# Patient Record
Sex: Female | Born: 1985 | Race: White | Hispanic: No | Marital: Married | Smoking: Never smoker
Health system: Southern US, Community
[De-identification: ages and names within clinical notes are randomized; demographics above are authoritative.]

## PROBLEM LIST (undated history)

## (undated) DIAGNOSIS — F419 Anxiety disorder, unspecified: Secondary | ICD-10-CM

## (undated) DIAGNOSIS — K219 Gastro-esophageal reflux disease without esophagitis: Secondary | ICD-10-CM

## (undated) HISTORY — PX: THERAPEUTIC ABORTION: SHX798

## (undated) HISTORY — DX: Gastro-esophageal reflux disease without esophagitis: K21.9

## (undated) HISTORY — DX: Anxiety disorder, unspecified: F41.9

## (undated) HISTORY — PX: BREAST SURGERY: SHX581

---

## 2010-06-02 ENCOUNTER — Encounter (INDEPENDENT_AMBULATORY_CARE_PROVIDER_SITE_OTHER): Payer: Self-pay | Admitting: *Deleted

## 2010-06-15 ENCOUNTER — Ambulatory Visit: Payer: Self-pay | Admitting: Gastroenterology

## 2010-06-15 ENCOUNTER — Encounter: Payer: Self-pay | Admitting: Gastroenterology

## 2010-06-15 LAB — CONVERTED CEMR LAB
ALT: 13 units/L (ref 0–35)
AST: 16 units/L (ref 0–37)
Albumin: 3.8 g/dL (ref 3.5–5.2)
Alkaline Phosphatase: 33 units/L — ABNORMAL LOW (ref 39–117)
BUN: 17 mg/dL (ref 6–23)
Basophils Absolute: 0 10*3/uL (ref 0.0–0.1)
Basophils Relative: 0.4 % (ref 0.0–3.0)
Bilirubin, Direct: 0.1 mg/dL (ref 0.0–0.3)
CO2: 28 meq/L (ref 19–32)
Calcium: 9.1 mg/dL (ref 8.4–10.5)
Chloride: 105 meq/L (ref 96–112)
Creatinine, Ser: 0.9 mg/dL (ref 0.4–1.2)
Eosinophils Absolute: 0.2 10*3/uL (ref 0.0–0.7)
Eosinophils Relative: 3.1 % (ref 0.0–5.0)
Ferritin: 32.8 ng/mL (ref 10.0–291.0)
Folate: 7.9 ng/mL
GFR calc non Af Amer: 78.18 mL/min (ref >60.00)
Glucose, Bld: 86 mg/dL (ref 70–99)
HCT: 36.5 % (ref 36.0–46.0)
Hemoglobin: 12.6 g/dL (ref 12.0–15.0)
IgA: 260 mg/dL (ref 68–378)
Iron: 129 ug/dL (ref 42–145)
Lymphocytes Relative: 33.7 % (ref 12.0–46.0)
Lymphs Abs: 2 10*3/uL (ref 0.7–4.0)
MCHC: 34.7 g/dL (ref 30.0–36.0)
MCV: 86.7 fL (ref 78.0–100.0)
Monocytes Absolute: 0.3 10*3/uL (ref 0.1–1.0)
Monocytes Relative: 5.1 % (ref 3.0–12.0)
Neutro Abs: 3.5 10*3/uL (ref 1.4–7.7)
Neutrophils Relative %: 57.7 % (ref 43.0–77.0)
Platelets: 171 10*3/uL (ref 150.0–400.0)
Potassium: 4.5 meq/L (ref 3.5–5.1)
RBC: 4.21 M/uL (ref 3.87–5.11)
RDW: 12.6 % (ref 11.5–14.6)
Saturation Ratios: 28.2 % (ref 20.0–50.0)
Sodium: 140 meq/L (ref 135–145)
TSH: 1.3 microintl units/mL (ref 0.35–5.50)
Total Bilirubin: 0.6 mg/dL (ref 0.3–1.2)
Total Protein: 6.8 g/dL (ref 6.0–8.3)
Transferrin: 327 mg/dL (ref 212.0–360.0)
Vitamin B-12: 406 pg/mL (ref 211–911)
WBC: 6 10*3/uL (ref 4.5–10.5)

## 2010-06-16 ENCOUNTER — Encounter: Payer: Self-pay | Admitting: Gastroenterology

## 2010-06-16 LAB — CONVERTED CEMR LAB: Tissue Transglutaminase Ab, IgA: 4.5 units (ref <20)

## 2010-06-18 ENCOUNTER — Ambulatory Visit (HOSPITAL_COMMUNITY)
Admission: RE | Admit: 2010-06-18 | Discharge: 2010-06-18 | Payer: Self-pay | Source: Home / Self Care | Attending: Gastroenterology | Admitting: Gastroenterology

## 2010-06-24 ENCOUNTER — Telehealth: Payer: Self-pay | Admitting: Gastroenterology

## 2010-07-14 ENCOUNTER — Encounter: Payer: Self-pay | Admitting: Gastroenterology

## 2010-07-14 ENCOUNTER — Ambulatory Visit
Admission: RE | Admit: 2010-07-14 | Discharge: 2010-07-14 | Payer: Self-pay | Source: Home / Self Care | Attending: Gastroenterology | Admitting: Gastroenterology

## 2010-07-14 ENCOUNTER — Other Ambulatory Visit: Payer: Self-pay | Admitting: Gastroenterology

## 2010-07-15 LAB — HELICOBACTER PYLORI SCREEN-BIOPSY: UREASE: NEGATIVE

## 2010-07-20 ENCOUNTER — Encounter: Payer: Self-pay | Admitting: Gastroenterology

## 2010-07-21 ENCOUNTER — Telehealth: Payer: Self-pay | Admitting: Gastroenterology

## 2010-07-22 ENCOUNTER — Telehealth: Payer: Self-pay | Admitting: Gastroenterology

## 2010-08-02 ENCOUNTER — Telehealth: Payer: Self-pay | Admitting: Gastroenterology

## 2010-08-02 DIAGNOSIS — K449 Diaphragmatic hernia without obstruction or gangrene: Secondary | ICD-10-CM | POA: Insufficient documentation

## 2010-08-03 NOTE — Letter (Signed)
Summary: New Patient letter  [facility]  [address] [place], [place] [address]   Phone: [phone]  Fax: [phone]       06/02/2010 MRN: [id number]  Cheryl Christensen [address] [place], [place]  [address]  Dear Ms. [facility],  Welcome to the Gastroenterology Division at [facility].    You are scheduled to see Dr.  Christella Hartigan on 07-16-10 at 9:00a.m. on the 3rd floor at [facility] [address]  We ask that you try to arrive at our office 15 minutes prior to your appointment time to allow for check-in.  We would like you to complete the enclosed self-administered evaluation form prior to your visit and bring it with you on the day of your appointment.  We will review it with you.  Also, please bring a complete list of all your medications or, if you prefer, bring the medication bottles and we will list them.  Please bring your insurance card so that we may make a copy of it.  If your insurance requires a referral to see a specialist, please bring your referral form from your primary care physician.  Co-payments are due at the time of your visit and may be paid by cash, check or credit card.     Your office visit will consist of a consult with your physician (includes a physical exam), any laboratory testing he/she may order, scheduling of any necessary diagnostic testing (e.g. x-ray, ultrasound, CT-scan), and scheduling of a procedure (e.g. Endoscopy, Colonoscopy) if required.  Please allow enough time on your schedule to allow for any/all of these possibilities.    If you cannot keep your appointment, please call [phone] to cancel or reschedule prior to your appointment date.  This allows [place] the opportunity to schedule an appointment for another patient in need of care.  If you do not cancel or reschedule by 5 p.m. the business day prior to your appointment date, you will be charged a $50.00 late cancellation/no-show fee.    Thank you for choosing  [facility] for your medical needs.  We appreciate the opportunity to care for you.  Please visit [place] at our website  to learn more about our practice.                     Sincerely,                                                             The Gastroenterology Division

## 2010-08-05 NOTE — Procedures (Addendum)
Summary: Upper Endoscopy  Patient: Cheryl Christensen Note: All result statuses are Final unless otherwise noted.  Tests: (1) Upper Endoscopy (EGD)   EGD Upper Endoscopy       DONE     [facility]     [address]     [place], [place]  [address]           ENDOSCOPY PROCEDURE REPORT           PATIENT:  [name], [name]  MR#:  [id number]     BIRTHDATE:  01-29-1986, 24 yrs. old  GENDER:  female           ENDOSCOPIST:  Vania Rea. Jarold Motto, MD, [facility]     Referred by:           PROCEDURE DATE:  07/14/2010     PROCEDURE:  EGD with biopsy, [address]     ASA CLASS:  Class I     INDICATIONS:  chest pain, dysphagia NORMAL UGI SERIES-BARIUM     SWALLOW.           MEDICATIONS:   Fentanyl 100 mcg IV, Versed 10 mg IV     TOPICAL ANESTHETIC:  Exactacain Spray           DESCRIPTION OF PROCEDURE:   After the risks benefits and     alternatives of the procedure were thoroughly explained, informed     consent was obtained.  The [facility] [id number] [id number] endoscope was     introduced through the mouth and advanced to the second portion of     the duodenum, without limitations.  The instrument was slowly     withdrawn as the mucosa was fully examined.     <<PROCEDUREIMAGES>>           ULTRASONIC FINDINGS:   A hiatal hernia was found. -3 CM HH NOTED.     Normal duodenal folds were noted. SI BX. DONE.  The stomach was     entered and closely examined. The antrum, angularis, and lesser     curvature were well visualized, including a retroflexed view of     the cardia and fundus. The stomach wall was normally distensable.     The scope passed easily through the pylorus into the duodenum. CLO     BX. DONE.  Esophagitis was found. HEALING EROSIONS.ESOPHAGEAL     BIOPSIES DONE.    Retroflexed views revealed a hiatal hernia.     The scope was then withdrawn from the patient and the procedure     completed.           COMPLICATIONS:  None           ENDOSCOPIC IMPRESSION:     1) Normal duodenal folds     2)  Normal stomach     3) Hiatal hernia     4) Esophagitis     5) A hiatal hernia     1.GERD     2.R/O EOSINOPHILIC ESOPHAGITIS     3.R/O CELIAC DISEASE     RECOMMENDATIONS:     1) Await biopsy results           2.RESUME ACIPHEX 20MG /QAM           3.SEE ME 3-4 WEEKS           REPEAT EXAM:  No           ______________________________     Vania Rea. Jarold Motto, MD, [facility]  CC:           n.     eSIGNED:   Vania Rea. Kenna Kirn at 07/14/2010 04:51 PM           Aram Candela, [id number]  Note: An exclamation mark (!) indicates a result that was not dispersed into the flowsheet. Document Creation Date: 07/14/2010 4:51 PM _______________________________________________________________________  (1) Order result status: Final Collection or observation date-time: 07/14/2010 16:42 Requested date-time:  Receipt date-time:  Reported date-time:  Referring Physician:   Ordering Physician: Sheryn Bison [phone]) Specimen Source:  Source: Launa Grill Order Number: [id number] Lab site:   Appended Document: Upper Endoscopy    Clinical Lists Changes  Problems: Changed problem from GERD (ICD-530.81) to HIATAL HERNIA WITH REFLUX (ICD-553.3)

## 2010-08-05 NOTE — Progress Notes (Signed)
Summary: Abd Pain  Phone Note Call from Patient Call back at Home Phone [phone]   Call For: Dr Jarold Motto Reason for Call: Talk to Nurse Summary of Call: Is still having abd pain. Initial call taken by: Leanor Kail [facility],  July 22, 2010 2:25 PM  Follow-up for Phone Call        Patient called back today to report she starting hurting last night like the original pain. She stated before, the pain stopped after a BM, but now it doesn't and her stools are now watery and she's gagging- dry heaves. Again, I offered to order the Abdominal U/S and Librax. Patient chose to order the Librax- she will call back if she worsens. Follow-up by: Graciella Freer RN,  July 22, 2010 2:49 PM    New/Updated Medications: LIBRAX 2.5-5 MG  CAPS (CLIDINIUM-CHLORDIAZEPOXIDE) 1 by mouth three times a day as needed for abdominal pain Prescriptions: LIBRAX 2.5-5 MG  CAPS (CLIDINIUM-CHLORDIAZEPOXIDE) 1 by mouth three times a day as needed for abdominal pain  #100 x 0   Entered by:   Graciella Freer RN   Authorized by:   Mardella Layman MD [facility]   Signed by:   Graciella Freer RN on 07/22/2010   Method used:   Electronically to        [facility]. [phone]* (retail)       [phone]       [place], [place]  [address]       Ph: [phone]       Fax: [phone]   RxID:   [id number]

## 2010-08-05 NOTE — Assessment & Plan Note (Signed)
Summary: acid reflux--ch.   History of Present Illness Visit Type: new patient  Primary GI MD: Sheryn Bison MD FACP FAGA Primary Provider: na Requesting Provider: na Chief Complaint: Discomfort in chest, fullness in chest after meals, and acid reflux  History of Present Illness:   [name] self referred for dysphagia for solids and liquids with postprandial fullness in her chest and minimal typical reflux symptoms. She does have belching and burping and uses p.r.n. and antacids. She has not had previous x-rays or endoscopic exams. Her swallowing problems have been present since childhood but worse over the last several months. She has not been to a physician in several years but denies any other medical problems or history of anemia. She is on daily birth control pills.   GI Review of Systems    Reports acid reflux and  belching.      Denies abdominal pain, bloating, chest pain, dysphagia with liquids, dysphagia with solids, heartburn, loss of appetite, nausea, vomiting, vomiting blood, weight loss, and  weight gain.        Denies anal fissure, black tarry stools, change in bowel habit, constipation, diarrhea, diverticulosis, fecal incontinence, heme positive stool, hemorrhoids, irritable bowel syndrome, jaundice, light color stool, liver problems, rectal bleeding, and  rectal pain.    Current Medications (verified): 1)  Loestrin 24 Fe 1-20 Mg-Mcg Tabs (Norethin Ace-Eth Estrad-Fe) .... As Directed  Allergies (verified): No Known Drug Allergies  Past History:  Past medical, surgical, family and social histories (including risk factors) reviewed for relevance to current acute and chronic problems.  Past Medical History: ANXIETY (ICD-300.00) BELCHING (ICD-787.3) GERD (ICD-530.81)  Past Surgical History: Unremarkable  Family History: Reviewed history and no changes required. 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  Alcohol Use - no Illicit Drug Use - no Smoking Status:  never Drug Use:  no  Review of Systems       The patient complains of anxiety-new.  The patient denies allergy/sinus, anemia, arthritis/joint pain, back pain, blood in urine, breast changes/lumps, change in vision, confusion, cough, coughing up blood, depression-new, fainting, fatigue, fever, headaches-new, hearing problems, heart murmur, heart rhythm changes, itching, menstrual pain, muscle pains/cramps, night sweats, nosebleeds, pregnancy symptoms, shortness of breath, skin rash, sleeping problems, sore throat, swelling of feet/legs, swollen lymph glands, thirst - excessive , urination - excessive , urination changes/pain, urine leakage, vision changes, and voice change.    Vital Signs:  Patient profile:   25 year old female Height:      66 inches Weight:      133 pounds BMI:     21.54 BSA:     1.68 Pulse rate:   88 / minute Pulse rhythm:   regular BP sitting:   124 / 72  (left arm) Cuff size:   regular  Vitals Entered By: Ok Anis CMA (June 15, 2010 2:24 PM)  Physical Exam  General:  Well developed, well nourished, no acute distress.healthy appearing.  healthy appearing.   Head:  Normocephalic and atraumatic. Eyes:  PERRLA, no icterus.exam deferred to patient's ophthalmologist.  exam deferred to patient's ophthalmologist.   Neck:  Supple; no masses or thyromegaly.Some small movable anterior cervical nodes bilaterally noted. Lungs:  Clear throughout to auscultation. Heart:  Regular rate and rhythm; no murmurs, rubs,  or bruits. Abdomen:  Soft, nontender and nondistended. No masses, hepatosplenomegaly or hernias noted. Normal bowel sounds. Msk:  Symmetrical with  no gross deformities. Normal posture. Extremities:  No clubbing, cyanosis, edema or deformities noted. Neurologic:  Alert and  oriented x4;  grossly normal neurologically. Skin:   Intact without significant lesions or rashes. Psych:  Alert and cooperative. Normal mood and affect.   Impression & Recommendations:  Problem # 1:  DYSPHAGIA UNSPECIFIED (ICD-787.20) Assessment Unchanged Her Symptoms suggest a possible underlying esophageal motility disorder--rule out achalasia. I have scheduled her for barium swallow and perhaps endoscopy. Screening labs have been ordered also.Empiric treatment with AcipHex 20 mg a day pending further workup and evaluation. She does have a sister who suffers from severe idiopathic iron deficiency. TLB-CBC Platelet - w/Differential (85025-CBCD) TLB-BMP (Basic Metabolic Panel-BMET) (80048-METABOL) TLB-Hepatic/Liver Function Pnl (80076-HEPATIC) TLB-TSH (Thyroid Stimulating Hormone) (84443-TSH) TLB-B12, Serum-Total ONLY (62952-W41) TLB-Ferritin (82728-FER) TLB-Folic Acid (Folate) (82746-FOL) TLB-IBC Pnl (Iron/FE;Transferrin) (83550-IBC) TLB-IgA (Immunoglobulin A) (82784-IGA) T-Sprue Panel (Celiac Disease Aby Eval) (83516x3/[address]) Barium Swallow with Tablet (BS w/tab)  Problem # 2:  GERD (ICD-530.81) Assessment: Unchanged Barium Swallow with office followup in 2 weeks' time as per above.  Patient Instructions: 1)  Your scheduled for a Barrium Swallow on 06/18/2010, please follow the seperate instructions. 2)  Take Aciphex once a day. 3)  Please go to the basement today for your labs.  4)  The medication list was reviewed and reconciled.  All changed / newly prescribed medications were explained.  A complete medication list was provided to the patient / caregiver. 5)  Please schedule a follow-up appointment in 2 weeks.  Prescriptions: ACIPHEX 20 MG TBEC (RABEPRAZOLE SODIUM) take one by mouth once daily  #30 x 3   Entered by:   Harlow Mares CMA (AAMA)   Authorized by:   Mardella Layman MD [facility]   Signed by:   Harlow Mares CMA (AAMA) on 06/15/2010   Method used:   Electronically to        [facility]. [phone]*  (retail)       [phone]       [place], [place]  [address]       Ph: [phone]       Fax: [phone]   RxID:   [id number]

## 2010-08-05 NOTE — Progress Notes (Signed)
Summary: Triage  Phone Note Call from Patient Call back at Home Phone [phone]   Caller: Patient Call For: Dr. Jarold Motto Reason for Call: Talk to Nurse Summary of Call: Thinks her Aciphex is causing abd pain Initial call taken by: Karna Christmas,  July 21, 2010 9:36 AM  Follow-up for Phone Call        Lmom for patient to call. She was on Omeprazole before; did she do ok with that?Graciella Freer RN  July 21, 2010 10:23 AM   Patient stated the Aciphex is causing the same abdominal pain as Omeprazole . She didn't take it today, and the pain is gone, but she has the burping and fullness again. Patient has a f/u on 08/06/10- ok to give Nexium samples?  Additional Follow-up for Phone Call Additional follow up Details #1::        OMPERAZOLE IS OK Additional Follow-up by: Mardella Layman MD FACG,  July 21, 2010 4:07 PM    Additional Follow-up for Phone Call Additional follow up Details #2::    Dr Jarold Motto, Omeprazole caused abdominal pain also- OK to try Nexium? Graciella Freer RN  July 21, 2010 4:11 PM   Additional Follow-up for Phone Call Additional follow up Details #3:: Details for Additional Follow-up Action Taken: STOP PPI'S...ULTRASOUND.Marland Kitchenas needed LIBRAX three times a day #100...  Spoke with patient about Dr Norval Gable orders/recommendations. Patient will stop the PPI's and will defer U/S until she sees Dr Jarold Motto on 08/06/10. She will call back about ordering the Librax. Graciella Freer RN  July 21, 2010 4:59 PM  Additional Follow-up by: Mardella Layman MD FACG,  July 21, 2010 4:26 PM

## 2010-08-05 NOTE — Miscellaneous (Signed)
Summary: Change in PPI  Clinical Lists Changes  Medications: Changed medication from ACIPHEX 20 MG TBEC (RABEPRAZOLE SODIUM) take one by mouth once daily to NEXIUM 40 MG CPDR (ESOMEPRAZOLE MAGNESIUM) take one by mouth once daily - Signed Rx of NEXIUM 40 MG CPDR (ESOMEPRAZOLE MAGNESIUM) take one by mouth once daily;  #30 x 6;  Signed;  Entered by: Harlow Mares CMA (AAMA);  Authorized by: Mardella Layman MD [facility];  Method used: Electronically to [facility]. #[id number]*, [address] [place], [place], [place]  [address], Ph: [phone], Fax: [phone]  patients insurance requires she try and famil BOTH omeprazole and Nexium.   Prescriptions: NEXIUM 40 MG CPDR (ESOMEPRAZOLE MAGNESIUM) take one by mouth once daily  #30 x 6   Entered by:   Harlow Mares CMA (AAMA)   Authorized by:   Mardella Layman MD [facility]   Signed by:   Harlow Mares CMA (AAMA) on 06/16/2010   Method used:   Electronically to        [facility]. [phone]* (retail)       [phone]       [place], [place]  [address]       Ph: [phone]       Fax: [phone]   RxID:   [id number]

## 2010-08-05 NOTE — Letter (Signed)
Summary: [name]  [facility]  [address] [place], [place] [address]   Phone: [phone]  Fax: [phone]        July 20, 2010 MRN: [id number]    Cheryl Christensen [address] [place], [place]  [address]    Dear Ms. Pennie,  I am pleased to inform you that the biopsies taken during your recent endoscopic examination did not show any evidence of cancer upon pathologic examination.Duodenal biopsies did not show celiac disease. Other biopsies are consistent with acid reflux esophagitis.  Additional information/recommendations:  __No further action is needed at this time.  Please follow-up with      your primary care physician for your other healthcare needs.  __ Please call [phone] to schedule a return visit to review      your condition.  _xx_ Continue with the treatment plan as outlined on the day of your      exam.  __ You should have a repeat endoscopic examination for this problem              in _ months/years.   Please call us if you are having persistent problems or have questions about your condition that have not been fully answered at this time.  Sincerely,  Mardella Layman MD [facility]  This letter has been electronically signed by your physician.  Appended Document: Patient Notice-Endo Biopsy Results Letter mailed

## 2010-08-05 NOTE — Miscellaneous (Signed)
Summary: Aciphex Samples  Clinical Lists Changes  Medications: Added new medication of ACIPHEX 20 MG TBEC (RABEPRAZOLE SODIUM) take one by mouth once daily

## 2010-08-05 NOTE — Progress Notes (Signed)
Summary: Unable to afford meds  Phone Note Call from Patient Call back at Home Phone [phone]   Call For: Dr Jarold Motto Summary of Call: Unable to afford the medicine ordered - wonders if there is a generic or something more affordable. Initial call taken by: Leanor Kail [facility],  June 24, 2010 8:03 AM  Follow-up for Phone Call        Pt states she cannot afford Nexium to even try and fail to get her insurance to pay for Aciphex. Pt would like a generic of something.  Told pt we would send in omeprazole for her to try.  Pt agreed and Rx was sent to pts pharmacy.  Follow-up by: Christie Nottingham CMA Duncan Dull),  June 24, 2010 8:34 AM    New/Updated Medications: OMEPRAZOLE 40 MG CPDR (OMEPRAZOLE) one tablet by mouth once daily Prescriptions: OMEPRAZOLE 40 MG CPDR (OMEPRAZOLE) one tablet by mouth once daily  #30 x 11   Entered by:   Christie Nottingham CMA (AAMA)   Authorized by:   Mardella Layman MD [facility]   Signed by:   Christie Nottingham CMA (AAMA) on 06/24/2010   Method used:   Electronically to        [facility]. [phone]* (retail)       [phone] [address]       [place], [place]  [address]       Ph: [phone]       Fax: [phone]   RxID:   [id number]

## 2010-08-05 NOTE — Miscellaneous (Signed)
Summary: Orders Update  Clinical Lists Changes  Orders: Added new Test order of TLB-H Pylori Screen Gastric Biopsy (83013-CLOTEST) - Signed 

## 2010-08-06 ENCOUNTER — Ambulatory Visit (INDEPENDENT_AMBULATORY_CARE_PROVIDER_SITE_OTHER): Payer: BC Managed Care – PPO | Admitting: Gastroenterology

## 2010-08-06 ENCOUNTER — Encounter: Payer: Self-pay | Admitting: Gastroenterology

## 2010-08-06 ENCOUNTER — Ambulatory Visit: Admit: 2010-08-06 | Payer: Self-pay | Admitting: Gastroenterology

## 2010-08-06 DIAGNOSIS — K219 Gastro-esophageal reflux disease without esophagitis: Secondary | ICD-10-CM

## 2010-08-06 DIAGNOSIS — R1084 Generalized abdominal pain: Secondary | ICD-10-CM | POA: Insufficient documentation

## 2010-08-06 DIAGNOSIS — R079 Chest pain, unspecified: Secondary | ICD-10-CM | POA: Insufficient documentation

## 2010-08-11 NOTE — Progress Notes (Signed)
Summary: speak to nurse  Phone Note Call from Patient Call back at Home Phone [phone]   Caller: Patient Call For: Dr Jarold Motto Reason for Call: Talk to Nurse Summary of Call: Patient wants to speak to nurse regarding her appt this Friday Initial call taken by: Tawni Levy,  August 02, 2010 12:28 PM  Follow-up for Phone Call        Left a message on patients machine to call back.  Follow-up by: Harlow Mares CMA [facility] Dull),  August 02, 2010 3:58 PM  Additional Follow-up for Phone Call Additional follow up Details #1::        pt will keep her appt on friday.  Additional Follow-up by: Harlow Mares CMA [facility]ull),  August 03, 2010 10:33 AM     Appended Document: speak to nurse pt will think about if she wants to have the appt or not, she will call back .

## 2010-08-11 NOTE — Assessment & Plan Note (Signed)
Summary: F/U FROM EGD...Cheryl Christensen W PT//CX POLICY ADVISED   Vital Signs:  Patient profile:   25 year old female Height:      66 inches Weight:      135 pounds BMI:     21.87 Pulse rate:   80 / minute Pulse rhythm:   regular BP sitting:   108 / 62  (left arm)  Vitals Entered By: Milford Cage NCMA (August 06, 2010 8:59 AM)  History of Present Illness Visit Type: Follow-up Visit Primary GI MD: Sheryn Bison MD FACP FAGA Primary Provider: N/A Requesting Provider: na Chief Complaint: follow-up EGD  Still c/o belching and acid reflux  abdominal pain is better History of Present Illness:   Endoscopy showed healing erosions. She is markedly improved on AcipHex but developed abdominal pain, discontinue this medication. She continues with postprandial pressure in her chest without true reflux symptoms or any hepatobiliary complaints. She does have some mild early satiety, but denies dysphagia. Since stopping AcipHex her abdominal cramping has abated. She denies abuse of alcohol, cigarettes, or NSAIDs.  Esophageal  biopsies were otherwise unremarkable, and exam for H. pylori was negative.The Patient relates she cannot pay for any more tests because of expense.Labs are reviewed and are essentially normal.   GI Review of Systems    Reports acid reflux and  belching.      Denies abdominal pain, bloating, chest pain, dysphagia with liquids, dysphagia with solids, heartburn, loss of appetite, nausea, vomiting, vomiting blood, weight loss, and  weight gain.        Denies anal fissure, black tarry stools, change in bowel habit, constipation, diarrhea, diverticulosis, fecal incontinence, heme positive stool, hemorrhoids, irritable bowel syndrome, jaundice, light color stool, liver problems, rectal bleeding, and  rectal pain.  Current Medications (verified): 1)  Loestrin 24 Fe 1-20 Mg-Mcg Tabs (Norethin Ace-Eth Estrad-Fe) .... As Directed  Allergies (verified): No Known Drug  Allergies  Past History:  Past medical, surgical, family and social histories (including risk factors) reviewed for relevance to current acute and chronic problems.  Past Medical History: Reviewed history from 06/15/2010 and no changes required. ANXIETY (ICD-300.00) BELCHING (ICD-787.3) GERD (ICD-530.81)  Past Surgical History: Reviewed history from 06/15/2010 and no changes required. Unremarkable  Family History: Reviewed history from 06/15/2010 and no changes required. 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  Alcohol Use - no Illicit Drug Use - no  Review of Systems  The patient denies allergy/sinus, anemia, anxiety-new, arthritis/joint pain, back pain, blood in urine, breast changes/lumps, change in vision, confusion, cough, coughing up blood, depression-new, fainting, fatigue, fever, headaches-new, hearing problems, heart murmur, heart rhythm changes, itching, menstrual pain, muscle pains/cramps, night sweats, nosebleeds, pregnancy symptoms, shortness of breath, skin rash, sleeping problems, sore throat, swelling of feet/legs, swollen lymph glands, thirst - excessive , urination - excessive , urination changes/pain, urine leakage, vision changes, and voice change.    Physical Exam  General:  Well developed, well nourished, no acute distress.healthy appearing.  healthy appearing.   Head:  Normocephalic and atraumatic. Eyes:  PERRLA, no icterus.exam deferred to patient's ophthalmologist.  exam deferred to patient's ophthalmologist.   Lungs:  Clear throughout to auscultation. Heart:  Regular rate and rhythm; no murmurs, rubs,  or bruits. Abdomen:  Soft, nontender and nondistended. No masses, hepatosplenomegaly or hernias noted. Normal bowel sounds. Psych:  Alert and cooperative. Normal mood and affect.  Impression & Recommendations:  Problem # 1:   CHEST PAIN (ICD-786.50) Assessment Unchanged Apparently her chest pain was improved with PPI therapy but she discontinued this medication because of abdominal pain. We have decided to proceed with a different approach to control her her GERD with Tagamet 300 mg twice a day for several weeks as tolerated. If she continues to be symptomatic I have suggested abdominal ultrasound and technetium gastric emptying scan. Her patient education video lasted reflux and its management was shown to the patient, and printed information concerning the acid reflux was given to her for review. If she responds to treatment I'll see her back in 4-6 weeks. I suggested we also try Carafate, and this still may be an option if she has symptoms on H2 blocker therapy. I doubt the patient will agree to any further testing because of expense constraints.  Problem # 2:  DYSPHAGIA UNSPECIFIED (ICD-787.20) Assessment: Improved  Problem # 3:  ABDOMINAL PAIN -GENERALIZED (ICD-789.07) Assessment: Improved apparently her abdominal cramping has resolved with discontinuation of PPI therapy. Assessment: Unchanged  Patient Instructions: 1)  You have been shown a short video regarding GERD today. 2)  Avoid foods high in acid content ( tomatoes, citrus juices, spicy foods) . Avoid eating within 3 to 4 hours of lying down or before exercising. Do not over eat; try smaller more frequent meals. Elevate head of bed four inches when sleeping.  3)  Please take Tagamet 300 mg by mouth two times a day (before breakfast and before dinner). This may be purchased over the counter. 4)  Please schedule a follow-up appointment in 6 weeks.  5)  The medication list was reviewed and reconciled.  All changed / newly prescribed medications were explained.  A complete medication list was provided to the patient / caregiver.

## 2011-04-27 ENCOUNTER — Other Ambulatory Visit: Payer: Self-pay | Admitting: Dermatology

## 2012-06-06 IMAGING — RF DG ESOPHAGUS
14 of 17 series · 19 of 24 positions shown · non-contrast
Comparison: None

CLINICAL DATA: Dysphagia.

ESOPHOGRAM/BARIUM SWALLOW
TECHNIQUE: Combined double contrast and single contrast
examination performed using effervescent crystals, thick barium
liquid, and thin barium liquid.
Fluoroscopy time:  1.4 minutes.

[Series 1: run · 2 of 7 slices shown (1 of 14)]
[im 1/7]
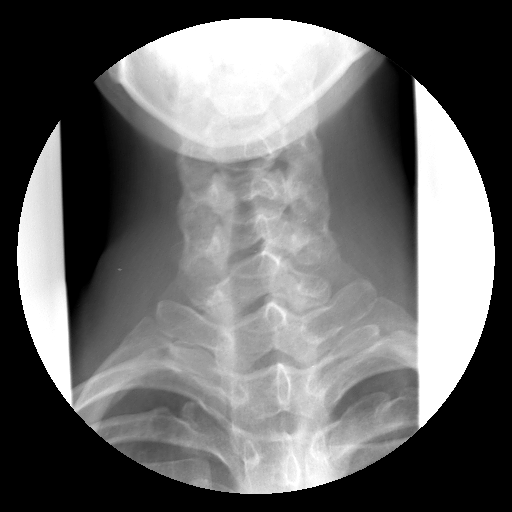
[im 4/7]
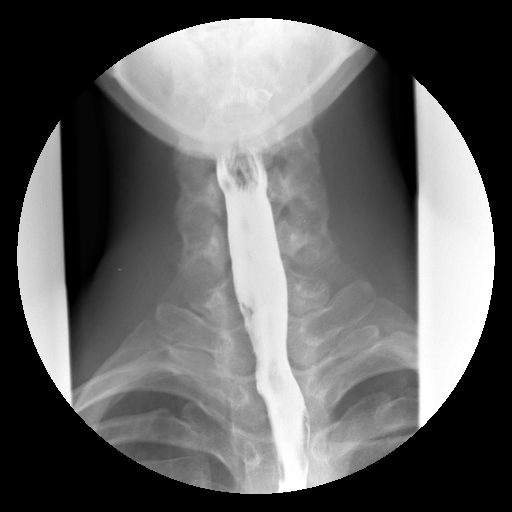

[Series 2: run · 5 of 9 slices shown (2 of 14)]
[im 1/9]
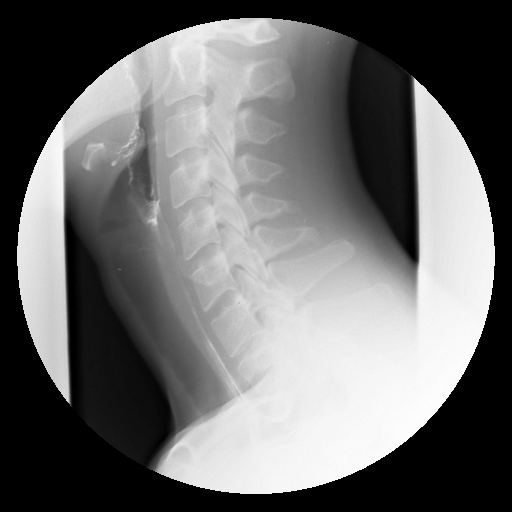
[im 2/9]
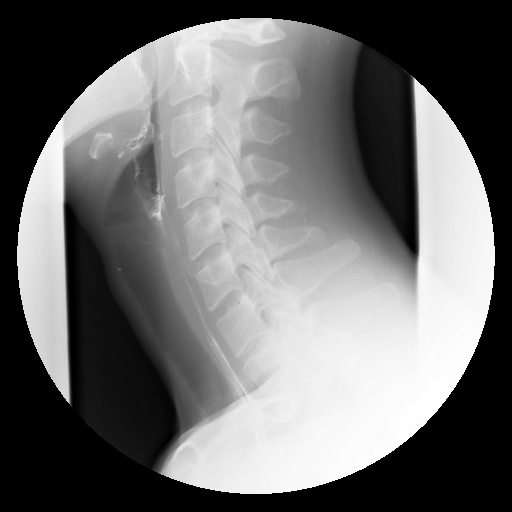
[im 4/9]
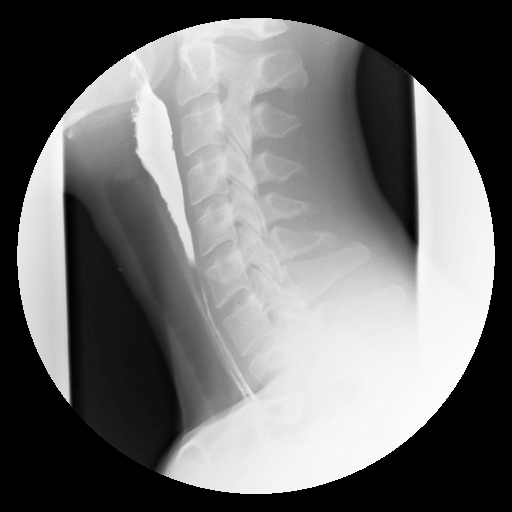
[im 5/9]
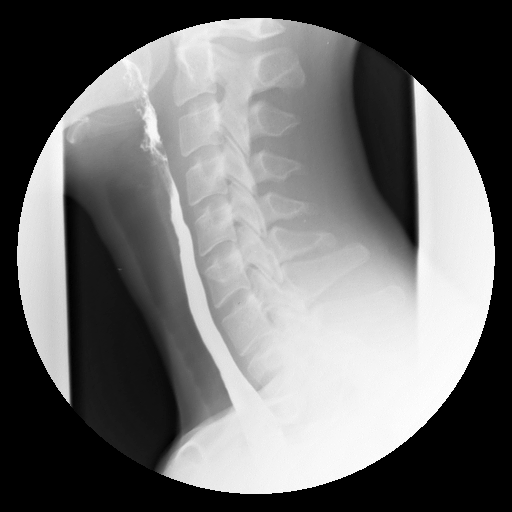
[im 9/9]
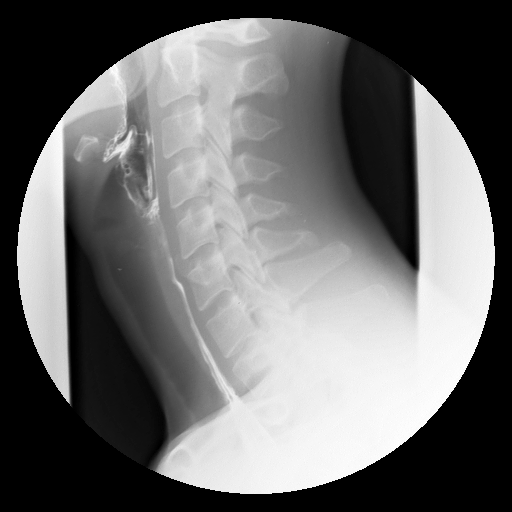

[Series 3: run · 1 of 2 slices shown (3 of 14)]
[im 1/2]
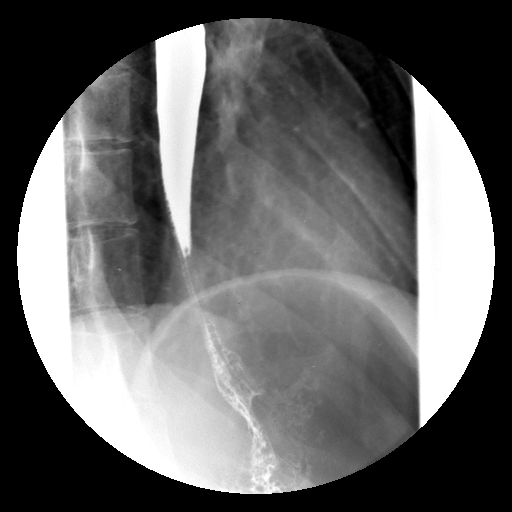

[Series 4: run · 1 of 1 slices shown (4 of 14)]
[im 1/1]
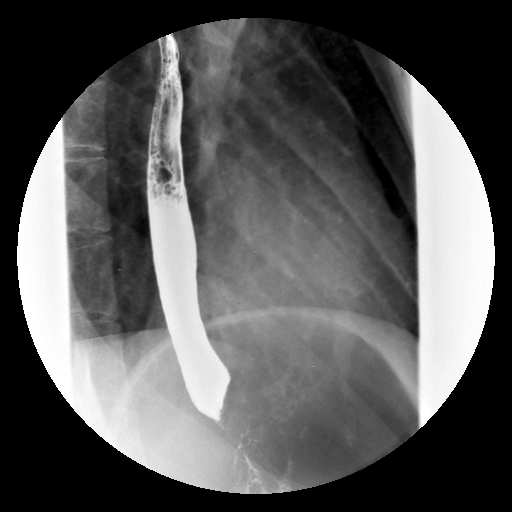

[Series 6: run · 1 of 2 slices shown (5 of 14)]
[im 1/2]
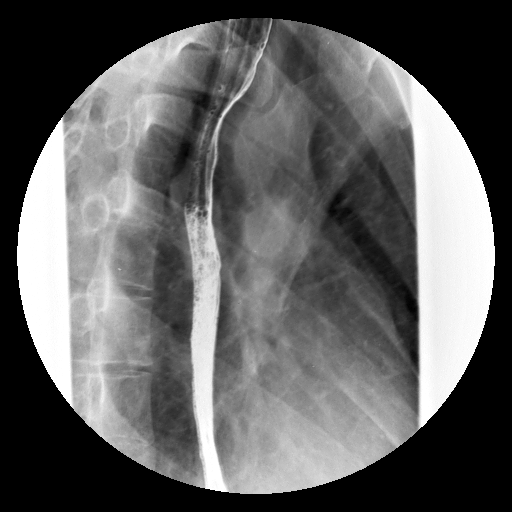

[Series 7: run · 1 of 1 slices shown (6 of 14)]
[im 1/1]
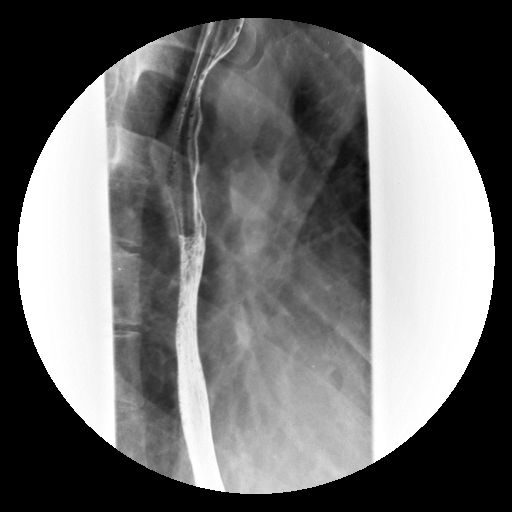

[Series 8: run · 1 of 1 slices shown (7 of 14)]
[im 1/1]
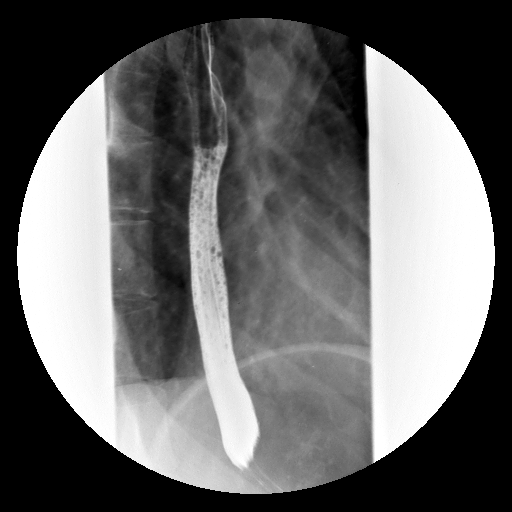

[Series 9: run · 1 of 1 slices shown (8 of 14)]
[im 1/1]
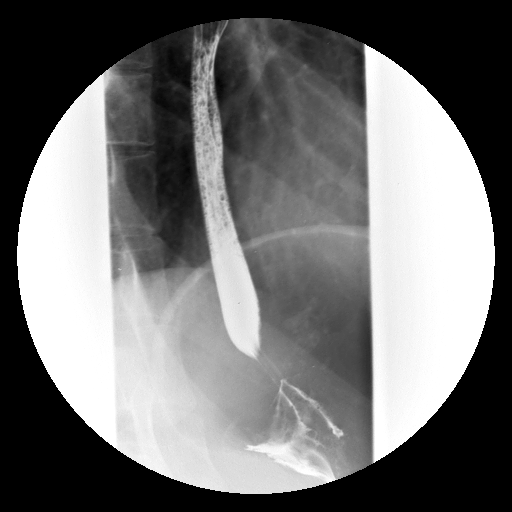

[Series 11: run · 1 of 1 slices shown (9 of 14)]
[im 1/1]
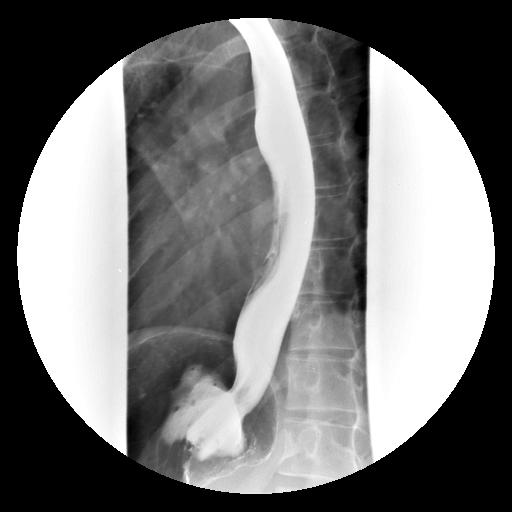

[Series 12: run · 1 of 1 slices shown (10 of 14)]
[im 1/1]
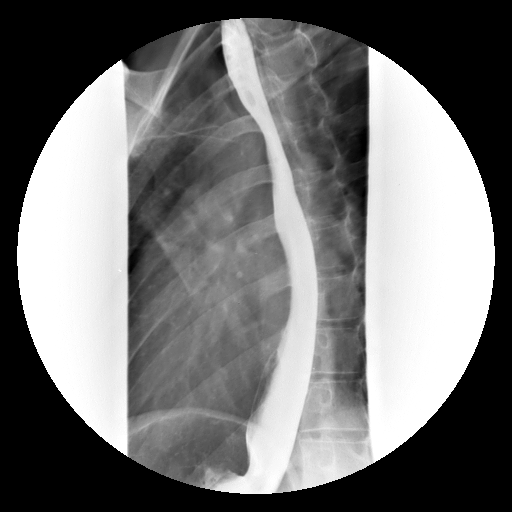

[Series 13: run · 1 of 1 slices shown (11 of 14)]
[im 1/1]
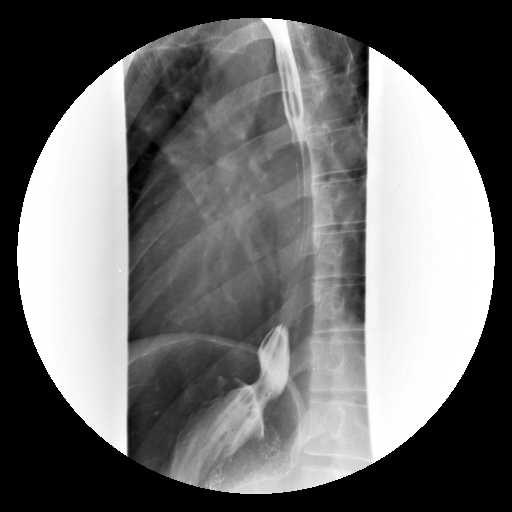

[Series 14: run · 1 of 1 slices shown (12 of 14)]
[im 1/1]
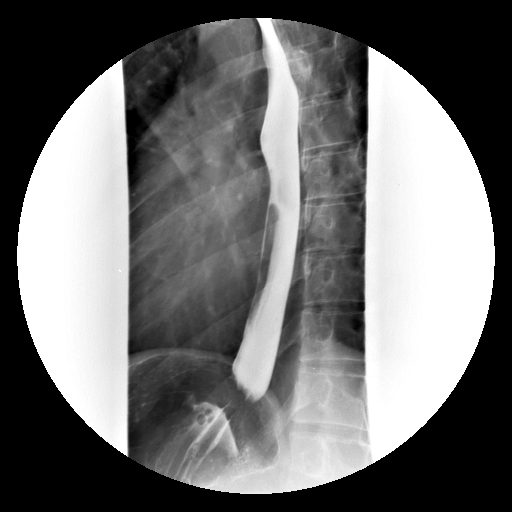

[Series 16: run · 1 of 1 slices shown (13 of 14)]
[im 1/1]
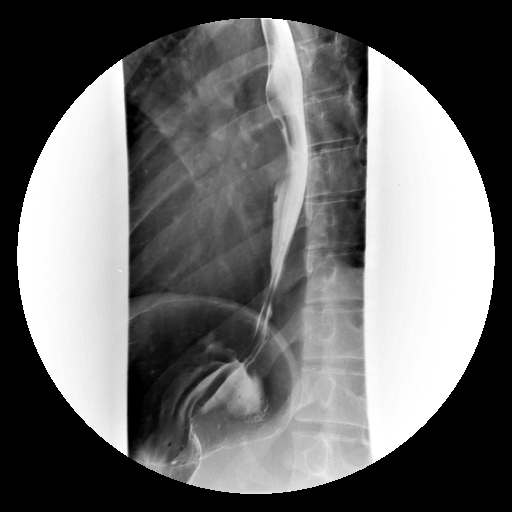

[Series 17: run · 1 of 1 slices shown (14 of 14)]
[im 1/1]
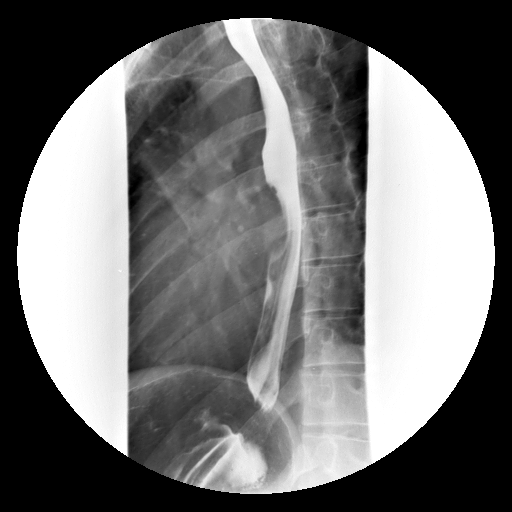

[19 of 24 positions shown; findings below may reference images not displayed]

FINDINGS: Initial barium swallows demonstrate normal pharyngeal
motion with swallowing.  No laryngeal penetration or aspiration.
No upper esophageal webs, strictures diverticuli.  The piriform
sinus and vallecular air spaces appear normal.

Esophageal motility is normal.  No intrinsic or extrinsic lesions
of the esophagus are identified and no mucosal abnormalities are
seen.  No hiatal hernia or gastroesophageal reflux was
demonstrated.  The 13 mm barium pill passed into the stomach
without difficulty.
IMPRESSION: Normal barium esophagram.

## 2012-10-09 ENCOUNTER — Ambulatory Visit: Payer: Self-pay | Admitting: Internal Medicine

## 2012-10-09 VITALS — BP 102/66 | HR 68 | Temp 98.7°F | Resp 18 | Ht 66.5 in | Wt 130.0 lb

## 2012-10-09 DIAGNOSIS — F411 Generalized anxiety disorder: Secondary | ICD-10-CM

## 2012-10-09 DIAGNOSIS — G4700 Insomnia, unspecified: Secondary | ICD-10-CM

## 2012-10-09 MED ORDER — ALPRAZOLAM 0.25 MG PO TABS: ORAL_TABLET | ORAL | Status: DC

## 2012-10-09 MED ORDER — FLUOXETINE HCL 20 MG PO TABS: 20.0000 mg | ORAL_TABLET | Freq: Every day | ORAL | Status: DC

## 2012-10-09 NOTE — Progress Notes (Signed)
Subjective:    Patient ID: Cheryl Christensen, female    DOB: [birth year], 27 y.o.   MRN: [id number]  HPI Pt is going through a break up. She is very teary-eyed in the room. She was in relationship for 4 years....states they fought a lot and decided to split. She hasn't been able to eat or sleep, been very anxious. They went to therapy, but to no avail. Saw Gweneth Dimitri for couples rx. 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 She is getting her Masters in Counseling. She is going to be moving out from their home. She has family here.  She states her anxiety started when she was in college. Sometimes kept her from sleeping, but didn't hold back from doing social stuff. States her anxiety affects her by making her feel "off", heart racing and tight. She says she already has a hiatal hernia. She states she does exercise, without fatigue or heart bothering her. Doesn't have trouble staying awake at her job or getting her homework done online. But states she does feel anxious all the time. Complains of having to reread her assignments over and over and over in order to stay focused and change. This was true in school as well. Father died 6 years ago pulmonary fibrosis. They had a contentious relationship. She could never please him . He was overprotective and she fought against it. Issues 1 resolved after his death and this is a sore point for her. No depression despair or suicidal ideation other than concern over this relationship. She has terrible trust issues which may have destroyed the relationship. Over the last 3-4 weeks she has been unable to sleep through the night, and unable to focus at work. She spent the day at work yesterday in tears. Overwhelmed by the amount of work plus school plus needing to move out etc. Has never been on medication for treatment of her anxiety disorder  Past medical history relatively normal Family history does not disclose psychiatric  problems/has a 39 year old sister/mom lives nearby and is healthy Social history-no illegal drug use/no past legal problems    Review of Systems  no fever chills night sweats or weight loss  Has palpitations when anxious no chest pain  No wheezing or shortness of breath No abdominal problems IUD in place    able to exercise and without symptoms of easy fatigue ability or shortness of breath Objective:   Physical Exam BP 102/66  Pulse 68  Temp(Src) 98.7 F (37.1 C) (Oral)  Resp 18  Ht 5' 6.5" (1.689 m)  Wt 130 lb (58.968 kg)  BMI 20.67 kg/m2  SpO2 100%  LMP 09/27/2012  crying throughout the exam HEENT clear No thyromegaly Mood is very anxious and upset/affect is appropriate Judgment intact Heart regular without murmur/rate 65       Assessment & Plan:   problem #1 generalized anxiety disorder with exacerbation secondary to relationship breakup Problem #2 insomnia secondary  problem #3 potential for attention deficit disorder is high  Meds ordered this encounter  Medications  . ALPRAZolam (XANAX) 0.25 MG tablet    Sig: 1-2 tabs tid as needed for anxiety and sleep    Dispense:  90 tablet    Refill:  0  . FLUoxetine (PROZAC) 20 MG tablet    Sig: Take 1 tablet (20 mg total) by mouth daily.    Dispense:  30 tablet    Refill:  3   Followup 2 weeks Discussed medical leave  from school or work is possible next Discussed counseling for  CBT

## 2012-10-23 ENCOUNTER — Ambulatory Visit: Payer: BC Managed Care – PPO | Admitting: Internal Medicine

## 2012-10-23 VITALS — BP 106/72 | HR 70 | Temp 98.7°F | Resp 16 | Ht 66.0 in | Wt 129.6 lb

## 2012-10-23 DIAGNOSIS — F411 Generalized anxiety disorder: Secondary | ICD-10-CM

## 2012-10-23 DIAGNOSIS — F5105 Insomnia due to other mental disorder: Secondary | ICD-10-CM

## 2012-10-23 DIAGNOSIS — F988 Other specified behavioral and emotional disorders with onset usually occurring in childhood and adolescence: Secondary | ICD-10-CM | POA: Insufficient documentation

## 2012-10-23 MED ORDER — ALPRAZOLAM 0.25 MG PO TABS: ORAL_TABLET | ORAL | Status: DC

## 2012-10-23 MED ORDER — AMPHETAMINE-DEXTROAMPHETAMINE 10 MG PO TABS: 10.0000 mg | ORAL_TABLET | Freq: Two times a day (BID) | ORAL | Status: DC

## 2012-10-23 NOTE — Progress Notes (Signed)
  Subjective:    Patient ID: Cheryl Christensen, female    DOB: September 22, 1985, 27 y.o.   MRN: [id number]  HPI see hx Better!! Still needs 2 doses of Xanax a day No side effects Prozac Overall feeling reduction in both depression and anxiety symptoms No history of significant distractibility, procrastination, stress from inability to finish tasks. HS and College plagued by these problems.  Family history father dead of pulmonary fibrosis Mother alive  39 year old sister lawyer     Review of Systems  otherwise negative Note numerous GI diagnoses/? Related to anxiety/nail mostly stable    Objective:   Physical Exam BP 106/72  Pulse 70  Temp(Src) 98.7 F (37.1 C) (Oral)  Resp 16  Ht 5\' 6"  (1.676 m)  Wt 129 lb 9.6 oz (58.786 kg)  BMI 20.93 kg/m2  SpO2 98%  LMP 10/16/2012 Self-report ADD scale positive 35 //particularly distracted by noises, lots of fidgeting, lots of restlessness, trouble waiting her turn, interrupts others //there are no 0  heart regular cranial nerves II through XII intact  mood stable/affect appropriate/thought content normal      Assessment & Plan:  ADD (attention deficit disorder)-trial adderall  GAD (generalized anxiety disorder)-no chg meds  Ref to counseling  Insomnia secondary to anxiety  Meds ordered this encounter  Medications  . ALPRAZolam (XANAX) 0.25 MG tablet    Sig: 1-2 tabs tid as needed for anxiety and sleep    Dispense:  90 tablet    Refill:  0  . amphetamine-dextroamphetamine (ADDERALL) 10 MG tablet    Sig: Take 1 tablet (10 mg total) by mouth 2 (two) times daily.    Dispense:  60 tablet    Refill:  0   4 weeks

## 2013-02-05 ENCOUNTER — Other Ambulatory Visit: Payer: Self-pay | Admitting: Internal Medicine

## 2013-02-06 ENCOUNTER — Other Ambulatory Visit: Payer: Self-pay | Admitting: Radiology

## 2015-02-18 ENCOUNTER — Other Ambulatory Visit: Payer: Self-pay

## 2015-02-19 LAB — CYTOLOGY - PAP

## 2015-08-26 ENCOUNTER — Ambulatory Visit (INDEPENDENT_AMBULATORY_CARE_PROVIDER_SITE_OTHER): Payer: Managed Care, Other (non HMO) | Admitting: Gastroenterology

## 2015-08-26 ENCOUNTER — Encounter: Payer: Self-pay | Admitting: Gastroenterology

## 2015-08-26 VITALS — BP 110/78 | HR 70 | Ht 66.0 in | Wt 142.4 lb

## 2015-08-26 DIAGNOSIS — K2100 Gastro-esophageal reflux disease with esophagitis, without bleeding: Secondary | ICD-10-CM

## 2015-08-26 MED ORDER — OMEPRAZOLE 40 MG PO CPDR: 40.0000 mg | DELAYED_RELEASE_CAPSULE | Freq: Every day | ORAL | Status: DC

## 2015-08-26 NOTE — Patient Instructions (Signed)
We have sent your new prescription to your pharmacy Follow up in 6 months

## 2015-08-26 NOTE — Progress Notes (Signed)
Cheryl Christensen    [id number]    Aug 09, 1985  Primary Care Physician:No primary care provider on file.  Referring Physician: No referring provider defined for this encounter.  Chief complaint: GERD  HPI: 30 year old female previously followed by Dr. Jarold Motto last office visit in 2012, here with complaints of acid reflux. Her symptoms are controlled with dietary changes but she develops severe heartburn with burping and regurgitation when she drinks alcohol and eats a meal after. She denies dysphagia, odynophagia, nausea, vomiting, abdominal pain, melena or blood per rectum. She has gained about 5-10 pounds in the past 6 months. She is currently taking famotidine 20 mg as needed. She has tried AcipHex in the past helped with heartburn but caused abdominal cramps and she discontinued it    Outpatient Encounter Prescriptions as of 08/26/2015  Medication Sig  . MICROGESTIN FE 1.5/30 1.5-30 MG-MCG tablet TK 1 T PO D  . [DISCONTINUED] ALPRAZolam (XANAX) 0.25 MG tablet TAKE 1-2 TABLETS BY MOUTH THREE TIMES DAILY AS NEEDED FOR ANXIETY AND SLEEP  . [DISCONTINUED] amphetamine-dextroamphetamine (ADDERALL) 10 MG tablet Take 1 tablet (10 mg total) by mouth 2 (two) times daily.  . [DISCONTINUED] FLUoxetine (PROZAC) 20 MG tablet Take 1 tablet (20 mg total) by mouth daily.   No facility-administered encounter medications on file as of 08/26/2015.    Allergies as of 08/26/2015  . (No Known Allergies)    Past Medical History  Diagnosis Date  . GERD (gastroesophageal reflux disease)   . Anxiety     No past surgical history on file.  Family History  Problem Relation Age of Onset  . Cancer Father   . Parkinson's disease Paternal Grandfather     Social History   Social History  . Marital Status: Married    Spouse Name: N/A  . Number of Children: N/A  . Years of Education: N/A   Occupational History  . Not on file.   Social History Main Topics  . Smoking status: Never  Smoker   . Smokeless tobacco: Never Used  . Alcohol Use: 0.0 oz/week    0 Standard drinks or equivalent per week  . Drug Use: No  . Sexual Activity: Not on file   Other Topics Concern  . Not on file   Social History Narrative      Review of systems: Review of Systems  Constitutional: Negative for fever and chills.  HENT: Negative.   Eyes: Negative for blurred vision.  Respiratory: Negative for cough, shortness of breath and wheezing.   Cardiovascular: Negative for chest pain and palpitations.  Gastrointestinal: as per HPI Genitourinary: Negative for dysuria, urgency, frequency and hematuria.  Musculoskeletal: Negative for myalgias, back pain and joint pain.  Skin: Negative for itching and rash.  Neurological: Negative for dizziness, tremors, focal weakness, seizures and loss of consciousness.  Endo/Heme/Allergies: Negative for environmental allergies.  Psychiatric/Behavioral: Negative for depression, suicidal ideas and hallucinations.  All other systems reviewed and are negative.   Physical Exam: Filed Vitals:   08/26/15 1434  BP: 110/78  Pulse: 70   Gen:      No acute distress HEENT:  EOMI, sclera anicteric Neck:     No masses; no thyromegaly Lungs:    Clear to auscultation bilaterally; normal respiratory effort CV:         Regular rate and rhythm; no murmurs Abd:      + bowel sounds; soft, non-tender; no palpable masses, no distension Ext:  No edema; adequate peripheral perfusion Skin:      Warm and dry; no rash Neuro: alert and oriented x 3 Psych: normal mood and affect  Data Reviewed:  EGD 07/2010 Erosive esophagitis, hiatal hernia  Normal barium esophagram.06/2010   Assessment and Plan/Recommendations: 30 year old female with complaints of worsening reflux symptoms with no dysphagia.  Start Omeprzole  daily, 30 minutes before breakfast  Discussed Anti reflux measures We will reevaluate in 3-6 months, if patient is unable to tolerate PPIs and has  persistent symptoms may have to consider antireflux surgery.  Iona Beard , MD [phone] Mon-Fri 8a-5p [phone] after 5p, weekends, holidays

## 2015-10-28 ENCOUNTER — Telehealth: Payer: Self-pay | Admitting: Gastroenterology

## 2015-10-28 ENCOUNTER — Other Ambulatory Visit: Payer: Self-pay

## 2015-10-28 MED ORDER — PANTOPRAZOLE SODIUM 40 MG PO TBEC: 40.0000 mg | DELAYED_RELEASE_TABLET | Freq: Every day | ORAL | Status: DC

## 2015-10-28 NOTE — Telephone Encounter (Signed)
Okay to send a prescription for different PPI that is covered by her insurance

## 2015-10-28 NOTE — Telephone Encounter (Signed)
Omeprazole "has definitely helped" but she admits to break through episodes. She would like to try a different medication. Please advise. She is on Omeprazole 40 mg.

## 2015-12-21 ENCOUNTER — Telehealth: Payer: Self-pay | Admitting: Gastroenterology

## 2015-12-21 ENCOUNTER — Other Ambulatory Visit: Payer: Self-pay

## 2015-12-21 MED ORDER — ESOMEPRAZOLE MAGNESIUM 40 MG PO CPDR: 40.0000 mg | DELAYED_RELEASE_CAPSULE | Freq: Every day | ORAL | Status: DC

## 2015-12-21 NOTE — Telephone Encounter (Signed)
Patient says it is the same as with Omeprazole. Worked okay in the beginning. Now it does not work so great. Can she change her PPI? She has been on Omeprazole 40 mg and Protonix 40 mg.

## 2015-12-21 NOTE — Telephone Encounter (Signed)
We can change to Nexium to 40mg . Thanks

## 2016-04-21 ENCOUNTER — Other Ambulatory Visit: Payer: Self-pay | Admitting: Gastroenterology

## 2016-05-18 ENCOUNTER — Other Ambulatory Visit: Payer: Self-pay | Admitting: Gastroenterology

## 2016-06-16 ENCOUNTER — Other Ambulatory Visit: Payer: Self-pay

## 2016-06-16 MED ORDER — ESOMEPRAZOLE MAGNESIUM 40 MG PO CPDR: DELAYED_RELEASE_CAPSULE | ORAL | 1 refills | Status: DC

## 2016-06-16 NOTE — Telephone Encounter (Signed)
Refilled esomeprazole as requested.

## 2016-08-06 ENCOUNTER — Other Ambulatory Visit: Payer: Self-pay | Admitting: Gastroenterology

## 2016-09-06 ENCOUNTER — Other Ambulatory Visit: Payer: Self-pay | Admitting: Gastroenterology

## 2016-10-06 ENCOUNTER — Other Ambulatory Visit: Payer: Self-pay | Admitting: Gastroenterology

## 2016-11-03 ENCOUNTER — Other Ambulatory Visit: Payer: Self-pay | Admitting: Gastroenterology

## 2016-11-30 ENCOUNTER — Other Ambulatory Visit: Payer: Self-pay | Admitting: Gastroenterology

## 2016-12-27 ENCOUNTER — Other Ambulatory Visit: Payer: Self-pay | Admitting: Gastroenterology

## 2017-01-22 ENCOUNTER — Other Ambulatory Visit: Payer: Self-pay | Admitting: Gastroenterology

## 2017-02-27 ENCOUNTER — Telehealth: Payer: Self-pay | Admitting: *Deleted

## 2017-02-27 MED ORDER — ESOMEPRAZOLE MAGNESIUM 40 MG PO CPDR: DELAYED_RELEASE_CAPSULE | ORAL | 3 refills | Status: DC

## 2017-02-27 NOTE — Telephone Encounter (Signed)
Refilled nexium per fax request

## 2017-06-14 ENCOUNTER — Encounter: Payer: Self-pay | Admitting: Podiatry

## 2017-06-14 ENCOUNTER — Ambulatory Visit (INDEPENDENT_AMBULATORY_CARE_PROVIDER_SITE_OTHER): Payer: Managed Care, Other (non HMO) | Admitting: Podiatry

## 2017-06-14 VITALS — BP 110/69 | HR 72 | Resp 16

## 2017-06-14 DIAGNOSIS — L603 Nail dystrophy: Secondary | ICD-10-CM

## 2017-06-14 NOTE — Progress Notes (Signed)
  Subjective:  Patient ID: Cheryl Christensen, female    DOB: 22-Jun-1986,  MRN: [id number] HPI Chief Complaint  Patient presents with  . Nail Problem    3rd toenail left - tender, thickened, discolored nail x several months, nail just lifted last night, tried trimming and using OTC meds/remedies-no help    31 y.o. female presents with the above complaint.     Past Medical History:  Diagnosis Date  . Anxiety   . GERD (gastroesophageal reflux disease)    No past surgical history on file.  Current Outpatient Medications:  .  esomeprazole (NEXIUM) 40 MG capsule, Take once daily, Disp: 30 capsule, Rfl: 3 .  MICROGESTIN FE 1.5/30 1.5-30 MG-MCG tablet, TK 1 T PO D, Disp: , Rfl: 6  No Known Allergies Review of Systems  All other systems reviewed and are negative.  Objective:   Vitals:   06/14/17 1505  BP: 110/69  Pulse: 72  Resp: 16    General: Well developed, nourished, in no acute distress, alert and oriented x3   Dermatological: Skin is warm, dry and supple bilateral. Nails x 10 are well maintained; remaining integument appears unremarkable at this time. There are no open sores, no preulcerative lesions, no rash or signs of infection present.  Distal onychomycosis to the third digit of the left foot no discoloration some subungual debris is present no erythema cellulitis drainage or odor no pain no malodor.  Vascular: Dorsalis Pedis artery and Posterior Tibial artery pedal pulses are 2/4 bilateral with immedate capillary fill time. Pedal hair growth present. No varicosities and no lower extremity edema present bilateral.   Neruologic: Grossly intact via light touch bilateral. Vibratory intact via tuning fork bilateral. Protective threshold with Semmes Wienstein monofilament intact to all pedal sites bilateral. Patellar and Achilles deep tendon reflexes 2+ bilateral. No Babinski or clonus noted bilateral.   Musculoskeletal: No gross boney pedal deformities bilateral. No pain,  crepitus, or limitation noted with foot and ankle range of motion bilateral. Muscular strength 5/5 in all groups tested bilateral.  Flexible hammertoe deformities with exception of the third toe bilaterally.  The toe is rectus.  Gait: Unassisted, Nonantalgic.    Radiographs:  None taken  Assessment & Plan:   Assessment: Probable nail dystrophy cannot rule out onychomycosis  Plan: Took a sample of the nail third digit of the left foot today the majority of the nail plate and sub-ungual debris was sampled.     Weston Fulco T. LeadingtonHyatt, [place] Dakota[place]PM

## 2017-07-05 ENCOUNTER — Telehealth: Payer: Self-pay | Admitting: *Deleted

## 2017-07-05 NOTE — Telephone Encounter (Signed)
-----   Message from Max T Hyatt, DPM sent at 07/05/2017  7:18 AM EST ----- Negative for fungus.  Nail dystrophy. 

## 2017-07-05 NOTE — Telephone Encounter (Signed)
-----   Message from Elinor ParkinsonMax T Hyatt, [place] sent at 07/05/2017  7:18 AM EST ----- Negative for fungus.  Nail dystrophy.

## 2017-07-05 NOTE — Telephone Encounter (Signed)
Left message informing her results had been reviewed to call. Pt called, I informed of Dr. Geryl RankinsHyatt's review of results.

## 2017-07-05 NOTE — Telephone Encounter (Signed)
Left message requesting call back for information concerning lab results.

## 2017-07-05 NOTE — Telephone Encounter (Signed)
I informed pt of Dr. Geryl RankinsHyatt's review of results and that no treatment was needed.

## 2017-07-10 ENCOUNTER — Other Ambulatory Visit: Payer: Self-pay | Admitting: Gastroenterology

## 2017-07-12 ENCOUNTER — Encounter: Payer: Managed Care, Other (non HMO) | Admitting: Podiatry

## 2017-07-12 NOTE — Progress Notes (Signed)
This encounter was created in error - please disregard.

## 2017-10-27 ENCOUNTER — Other Ambulatory Visit: Payer: Self-pay | Admitting: Gastroenterology

## 2018-03-21 ENCOUNTER — Other Ambulatory Visit: Payer: Self-pay | Admitting: Gastroenterology

## 2018-12-28 LAB — OB RESULTS CONSOLE RUBELLA ANTIBODY, IGM: Rubella: IMMUNE

## 2018-12-28 LAB — OB RESULTS CONSOLE ANTIBODY SCREEN: Antibody Screen: NEGATIVE

## 2018-12-28 LAB — OB RESULTS CONSOLE HEPATITIS B SURFACE ANTIGEN: Hepatitis B Surface Ag: NEGATIVE

## 2018-12-28 LAB — OB RESULTS CONSOLE GC/CHLAMYDIA
Chlamydia: NEGATIVE
Gonorrhea: NEGATIVE

## 2018-12-28 LAB — OB RESULTS CONSOLE ABO/RH: RH Type: POSITIVE

## 2018-12-28 LAB — OB RESULTS CONSOLE HIV ANTIBODY (ROUTINE TESTING): HIV: NONREACTIVE

## 2018-12-28 LAB — OB RESULTS CONSOLE RPR: RPR: NONREACTIVE

## 2019-06-19 ENCOUNTER — Other Ambulatory Visit: Payer: Self-pay

## 2019-06-19 ENCOUNTER — Encounter: Payer: Self-pay | Admitting: Emergency Medicine

## 2019-06-19 ENCOUNTER — Ambulatory Visit
Admission: EM | Admit: 2019-06-19 | Discharge: 2019-06-19 | Disposition: A | Discharge status: Home / Self Care | Payer: 59 | Attending: Emergency Medicine | Admitting: Emergency Medicine

## 2019-06-19 DIAGNOSIS — R5383 Other fatigue: Secondary | ICD-10-CM | POA: Diagnosis not present

## 2019-06-19 DIAGNOSIS — R509 Fever, unspecified: Secondary | ICD-10-CM

## 2019-06-19 DIAGNOSIS — Z0189 Encounter for other specified special examinations: Secondary | ICD-10-CM

## 2019-06-19 DIAGNOSIS — Z20828 Contact with and (suspected) exposure to other viral communicable diseases: Secondary | ICD-10-CM | POA: Diagnosis not present

## 2019-06-19 DIAGNOSIS — R05 Cough: Secondary | ICD-10-CM | POA: Diagnosis not present

## 2019-06-19 NOTE — Discharge Instructions (Addendum)
Your COVID test is pending.  You should self quarantine until your test result is back and is negative.   ° °Go to the emergency department if you develop high fever, shortness of breath, severe diarrhea, or other concerning symptoms.   ° °

## 2019-06-19 NOTE — ED Provider Notes (Signed)
[facility]    CSN: [id number] Arrival date & time: 06/19/19  1401      History   Chief Complaint Chief Complaint  Patient presents with  . Cough    HPI Cheryl Christensen is a 33 y.o. female.   Patient presents with fatigue x4 days.  She reports she had a nonproductive cough and fever 3 days ago but none since.  She denies shortness of breath, congestion, vomiting, diarrhea, rash, or other symptoms.  Treatment attempted at home with cough drops.  Patient is [redacted] weeks pregnant.    The history is provided by the patient.    Past Medical History:  Diagnosis Date  . Anxiety   . GERD (gastroesophageal reflux disease)     Patient Active Problem List   Diagnosis Date Noted  . ADD (attention deficit disorder) 10/23/2012  . GAD (generalized anxiety disorder) 10/23/2012  . CHEST PAIN 08/06/2010  . ABDOMINAL PAIN -GENERALIZED 08/06/2010  . HIATAL HERNIA WITH REFLUX 08/02/2010    History reviewed. No pertinent surgical history.  OB History    Gravida  1   Para      Term      Preterm      AB      Living        SAB      TAB      Ectopic      Multiple      Live Births               Home Medications    Prior to Admission medications   Medication Sig Start Date End Date Taking? Authorizing Provider  esomeprazole (NEXIUM) 40 MG capsule TAKE 1 CAPSULE BY MOUTH EVERY DAY 07/10/17   Napoleon FormNandigam, Kavitha V, MD  MICROGESTIN FE 1.5/30 1.5-30 MG-MCG tablet TK 1 T PO D 08/04/15   [provider]    Family History Family History  Problem Relation Age of Onset  . Cancer Father   . Parkinson's disease Paternal Grandfather     Social History Social History   Tobacco Use  . Smoking status: Never Smoker  . Smokeless tobacco: Never Used  Substance Use Topics  . Alcohol use: Yes    Alcohol/week: 0.0 standard drinks  . Drug use: No     Allergies   Patient has no known allergies.   Review of Systems Review of Systems  Constitutional:  Positive for fatigue and fever. Negative for chills.  HENT: Negative for ear pain and sore throat.   Eyes: Negative for pain and visual disturbance.  Respiratory: Positive for cough. Negative for shortness of breath.   Cardiovascular: Negative for chest pain and palpitations.  Gastrointestinal: Negative for abdominal pain, diarrhea and vomiting.  Genitourinary: Negative for dysuria and hematuria.  Musculoskeletal: Negative for arthralgias and back pain.  Skin: Negative for color change and rash.  Neurological: Negative for seizures and syncope.  All other systems reviewed and are negative.    Physical Exam Triage Vital Signs ED Triage Vitals  Enc Vitals Group     BP      Pulse      Resp      Temp      Temp src      SpO2      Weight      Height      Head Circumference      Peak Flow      Pain Score      Pain Loc  Pain Edu?      Excl. in GC?    No data found.  Updated Vital Signs BP 114/76   Pulse 82   Temp 98.8 F (37.1 C)   Resp 18   Wt 180 lb (81.6 kg)   LMP 10/17/2018   SpO2 97%   BMI 29.05 kg/m   Visual Acuity Right Eye Distance:   Left Eye Distance:   Bilateral Distance:    Right Eye Near:   Left Eye Near:    Bilateral Near:     Physical Exam Vitals and nursing note reviewed.  Constitutional:      General: She is not in acute distress.    Appearance: She is well-developed. She is not ill-appearing.  HENT:     Head: Normocephalic and atraumatic.     Right Ear: Tympanic membrane normal.     Left Ear: Tympanic membrane normal.     Nose: Nose normal.     Mouth/Throat:     Mouth: Mucous membranes are moist.     Pharynx: Oropharynx is clear.  Eyes:     Conjunctiva/sclera: Conjunctivae normal.  Cardiovascular:     Rate and Rhythm: Normal rate and regular rhythm.     Heart sounds: No murmur.  Pulmonary:     Effort: Pulmonary effort is normal. No respiratory distress.     Breath sounds: Normal breath sounds. No wheezing or rhonchi.    Abdominal:     General: Bowel sounds are normal.     Palpations: Abdomen is soft.     Tenderness: There is no abdominal tenderness. There is no guarding or rebound.  Musculoskeletal:     Cervical back: Neck supple.  Skin:    General: Skin is warm and dry.     Findings: No rash.  Neurological:     General: No focal deficit present.     Mental Status: She is alert and oriented to person, place, and time.  Psychiatric:        Mood and Affect: Mood normal.        Behavior: Behavior normal.      UC Treatments / Results  Labs (all labs ordered are listed, but only abnormal results are displayed) Labs Reviewed  NOVEL CORONAVIRUS, NAA    EKG   Radiology No results found.  Procedures Procedures (including critical care time)  Medications Ordered in UC Medications - No data to display  Initial Impression / Assessment and Plan / UC Course  I have reviewed the triage vital signs and the nursing notes.  Pertinent labs & imaging results that were available during my care of the patient were reviewed by me and considered in my medical decision making (see chart for details).    Fatigue. Patient request for COVID test.  COVID test performed here.  Instructed patient to self quarantine until the test result is back.  Instructed patient to go to the emergency department if she develops high fever, shortness of breath, severe diarrhea, or other concerning symptoms.  Instructed patient to follow up with her PCP or OB/GYN as scheduled.  Patient agrees with plan of care.    Final Clinical Impressions(s) / UC Diagnoses   Final diagnoses:  Patient request for diagnostic testing  Fatigue, unspecified type     Discharge Instructions     Your COVID test is pending.  You should self quarantine until your test result is back and is negative.    Go to the emergency department if you develop high fever, shortness of  breath, severe diarrhea, or other concerning symptoms.       ED  Prescriptions    None     PDMP not reviewed this encounter.   Sharion Balloon, NP 06/19/19 1432

## 2019-06-19 NOTE — ED Triage Notes (Signed)
Patient in office today c/o cough and fever on Sunday but would like to checked for covid  Patient is currently [redacted] wks pregnant.   OTC: cough drop

## 2019-06-21 ENCOUNTER — Telehealth (HOSPITAL_COMMUNITY): Payer: Self-pay | Admitting: Emergency Medicine

## 2019-06-21 LAB — NOVEL CORONAVIRUS, NAA: SARS-CoV-2, NAA: DETECTED — AB

## 2019-06-21 NOTE — Telephone Encounter (Addendum)
Your test for COVID-19 was positive, meaning that you were infected with the novel coronavirus and could give the germ to others.  Please continue isolation at home for at least 10 days since the start of your symptoms. If you do not have symptoms, please isolate at home for 10 days from the day you were tested. Once you complete your 10 day quarantine, you may return to normal activities as long as you've not had a fever for over 24 hours(without taking fever reducing medicine) and your symptoms are improving. Please continue good preventive care measures, including:  frequent hand-washing, avoid touching your face, cover coughs/sneezes, stay out of crowds and keep a 6 foot distance from others.  Go to the nearest hospital emergency room if fever/cough/breathlessness are severe or illness seems like a threat to life.  Patient contacted by phone and made aware of    results. Pt verbalized understanding and had all questions answered. Pt is [redacted] weeks pregnant, instructed to call her OB and do a virtual visit for any questions regarding pregnancy and covid.

## 2019-07-04 LAB — OB RESULTS CONSOLE GBS: GBS: POSITIVE

## 2019-07-11 ENCOUNTER — Encounter (HOSPITAL_COMMUNITY): Payer: Self-pay | Admitting: *Deleted

## 2019-07-11 ENCOUNTER — Telehealth (HOSPITAL_COMMUNITY): Payer: Self-pay | Admitting: *Deleted

## 2019-07-11 NOTE — Telephone Encounter (Signed)
Preadmission screen  

## 2019-07-12 ENCOUNTER — Telehealth (HOSPITAL_COMMUNITY): Payer: Self-pay | Admitting: *Deleted

## 2019-07-12 NOTE — Telephone Encounter (Signed)
Preadmission screen  

## 2019-07-15 ENCOUNTER — Telehealth (HOSPITAL_COMMUNITY): Payer: Self-pay | Admitting: *Deleted

## 2019-07-15 NOTE — Telephone Encounter (Signed)
Preadmission screen  

## 2019-07-16 ENCOUNTER — Encounter (HOSPITAL_COMMUNITY): Payer: Self-pay

## 2019-07-16 ENCOUNTER — Telehealth (HOSPITAL_COMMUNITY): Payer: Self-pay | Admitting: *Deleted

## 2019-07-16 NOTE — Telephone Encounter (Signed)
Preadmission screen  

## 2019-07-16 NOTE — Patient Instructions (Signed)
JOHANN GASCOIGNE  07/16/2019   Your procedure is scheduled on:  07/23/2019  Arrive at 1030 at Entrance C on [address] at [facility]  and [employer]. You are invited to use the FREE valet parking or use the Visitor's parking deck.  Pick up the phone at the desk and dial [phone].  Call this number if you have problems the morning of surgery: [phone]  Remember:   Do not eat food:(After Midnight) Desps de medianoche.  Do not drink clear liquids: (After Midnight) Desps de medianoche.  Take these medicines the morning of surgery with A SIP OF WATER:  None, but can take your pepcid    Do not wear jewelry, make-up or nail polish.  Do not wear lotions, powders, or perfumes. Do not wear deodorant.  Do not shave 48 hours prior to surgery.  Do not bring valuables to the hospital.  [facility] is not   responsible for any belongings or valuables brought to the hospital.  Contacts, dentures or bridgework may not be worn into surgery.  Leave suitcase in the car. After surgery it may be brought to your room.  For patients admitted to the hospital, checkout time is 11:00 AM the day of              discharge.      Please read over the following fact sheets that you were given:     Preparing for Surgery

## 2019-07-21 ENCOUNTER — Other Ambulatory Visit: Payer: Self-pay

## 2019-07-21 ENCOUNTER — Other Ambulatory Visit (HOSPITAL_COMMUNITY)
Admission: RE | Admit: 2019-07-21 | Discharge: 2019-07-21 | Disposition: A | Discharge status: Home / Self Care | Payer: PRIVATE HEALTH INSURANCE | Source: Ambulatory Visit | Attending: Obstetrics and Gynecology | Admitting: Obstetrics and Gynecology

## 2019-07-21 DIAGNOSIS — Z01812 Encounter for preprocedural laboratory examination: Secondary | ICD-10-CM | POA: Insufficient documentation

## 2019-07-21 LAB — CBC WITH DIFFERENTIAL/PLATELET
Abs Immature Granulocytes: 0.04 10*3/uL (ref 0.00–0.07)
Basophils Absolute: 0 10*3/uL (ref 0.0–0.1)
Basophils Relative: 0 %
Eosinophils Absolute: 0.2 10*3/uL (ref 0.0–0.5)
Eosinophils Relative: 2 %
HCT: 35.2 % — ABNORMAL LOW (ref 36.0–46.0)
Hemoglobin: 11.7 g/dL — ABNORMAL LOW (ref 12.0–15.0)
Immature Granulocytes: 1 %
Lymphocytes Relative: 19 %
Lymphs Abs: 1.6 10*3/uL (ref 0.7–4.0)
MCH: 29.6 pg (ref 26.0–34.0)
MCHC: 33.2 g/dL (ref 30.0–36.0)
MCV: 89.1 fL (ref 80.0–100.0)
Monocytes Absolute: 0.5 10*3/uL (ref 0.1–1.0)
Monocytes Relative: 5 %
Neutro Abs: 6.4 10*3/uL (ref 1.7–7.7)
Neutrophils Relative %: 73 %
Platelets: 173 10*3/uL (ref 150–400)
RBC: 3.95 MIL/uL (ref 3.87–5.11)
RDW: 13.4 % (ref 11.5–15.5)
WBC: 8.8 10*3/uL (ref 4.0–10.5)
nRBC: 0 % (ref 0.0–0.2)

## 2019-07-21 LAB — ABO/RH: ABO/RH(D): O POS

## 2019-07-21 LAB — RPR: RPR Ser Ql: NONREACTIVE

## 2019-07-21 NOTE — MAU Note (Signed)
Pt here for pre op lab work. Pt denies any new onset of symptoms since previous covid positive testing in dec. Due to no new symptoms pt does not need to be tested again today. Pt verbalizes understanding to not remove blood bank bracelet.

## 2019-07-23 ENCOUNTER — Encounter (HOSPITAL_COMMUNITY): Payer: Self-pay | Admitting: Obstetrics and Gynecology

## 2019-07-23 ENCOUNTER — Encounter (HOSPITAL_COMMUNITY)
Admission: RE | Disposition: A | Discharge status: Home / Self Care | Payer: Self-pay | Source: Home / Self Care | Attending: Obstetrics and Gynecology

## 2019-07-23 ENCOUNTER — Inpatient Hospital Stay (HOSPITAL_COMMUNITY): Payer: PRIVATE HEALTH INSURANCE | Admitting: Anesthesiology

## 2019-07-23 ENCOUNTER — Other Ambulatory Visit: Payer: Self-pay

## 2019-07-23 ENCOUNTER — Inpatient Hospital Stay (HOSPITAL_COMMUNITY)
Admission: RE | Admit: 2019-07-23 | Discharge: 2019-07-25 | DRG: 788 | Disposition: A | Discharge status: Home / Self Care | Payer: PRIVATE HEALTH INSURANCE | Attending: Obstetrics and Gynecology | Admitting: Obstetrics and Gynecology

## 2019-07-23 DIAGNOSIS — O9902 Anemia complicating childbirth: Secondary | ICD-10-CM | POA: Diagnosis present

## 2019-07-23 DIAGNOSIS — Z01812 Encounter for preprocedural laboratory examination: Secondary | ICD-10-CM | POA: Diagnosis not present

## 2019-07-23 DIAGNOSIS — D649 Anemia, unspecified: Secondary | ICD-10-CM | POA: Diagnosis present

## 2019-07-23 DIAGNOSIS — O99824 Streptococcus B carrier state complicating childbirth: Secondary | ICD-10-CM | POA: Diagnosis present

## 2019-07-23 DIAGNOSIS — Z8616 Personal history of COVID-19: Secondary | ICD-10-CM

## 2019-07-23 DIAGNOSIS — Z3A39 39 weeks gestation of pregnancy: Secondary | ICD-10-CM | POA: Diagnosis not present

## 2019-07-23 DIAGNOSIS — O26893 Other specified pregnancy related conditions, third trimester: Secondary | ICD-10-CM | POA: Diagnosis present

## 2019-07-23 LAB — TYPE AND SCREEN
ABO/RH(D): O POS
Antibody Screen: NEGATIVE

## 2019-07-23 SURGERY — Surgical Case
Anesthesia: Spinal | Site: Abdomen | Wound class: Clean Contaminated

## 2019-07-23 MED ORDER — SODIUM CHLORIDE 0.9 % IV SOLN: INTRAVENOUS | Status: DC | PRN

## 2019-07-23 MED ORDER — MORPHINE SULFATE (PF) 0.5 MG/ML IJ SOLN
INTRAMUSCULAR | Status: DC | PRN
  Administered 2019-07-23: .15 mg via INTRATHECAL

## 2019-07-23 MED ORDER — OXYTOCIN 40 UNITS IN NORMAL SALINE INFUSION - SIMPLE MED
INTRAVENOUS | Status: DC | PRN
  Administered 2019-07-23: 40 mL via INTRAVENOUS

## 2019-07-23 MED ORDER — SIMETHICONE 80 MG PO CHEW: 80.0000 mg | CHEWABLE_TABLET | ORAL | Status: DC | PRN

## 2019-07-23 MED ORDER — SODIUM CHLORIDE 0.9 % IV SOLN
INTRAVENOUS | Status: AC
  Filled 2019-07-23: qty 2

## 2019-07-23 MED ORDER — BUPIVACAINE IN DEXTROSE 0.75-8.25 % IT SOLN
INTRATHECAL | Status: DC | PRN
  Administered 2019-07-23: 1.6 mL via INTRATHECAL

## 2019-07-23 MED ORDER — OXYTOCIN 40 UNITS IN NORMAL SALINE INFUSION - SIMPLE MED: 2.5000 [IU]/h | INTRAVENOUS | Status: AC

## 2019-07-23 MED ORDER — KETOROLAC TROMETHAMINE 30 MG/ML IJ SOLN
INTRAMUSCULAR | Status: AC
  Filled 2019-07-23: qty 1

## 2019-07-23 MED ORDER — SCOPOLAMINE 1 MG/3DAYS TD PT72
1.0000 | MEDICATED_PATCH | Freq: Once | TRANSDERMAL | Status: DC
  Administered 2019-07-23: 1.5 mg via TRANSDERMAL

## 2019-07-23 MED ORDER — LACTATED RINGERS IV SOLN: INTRAVENOUS | Status: DC

## 2019-07-23 MED ORDER — DEXAMETHASONE SODIUM PHOSPHATE 4 MG/ML IJ SOLN
INTRAMUSCULAR | Status: AC
  Filled 2019-07-23: qty 1

## 2019-07-23 MED ORDER — DIPHENHYDRAMINE HCL 50 MG/ML IJ SOLN
12.5000 mg | INTRAMUSCULAR | Status: DC | PRN
  Filled 2019-07-23: qty 1

## 2019-07-23 MED ORDER — DIPHENHYDRAMINE HCL 25 MG PO CAPS: 25.0000 mg | ORAL_CAPSULE | Freq: Four times a day (QID) | ORAL | Status: DC | PRN

## 2019-07-23 MED ORDER — FENTANYL CITRATE (PF) 100 MCG/2ML IJ SOLN
INTRAMUSCULAR | Status: AC
  Filled 2019-07-23: qty 2

## 2019-07-23 MED ORDER — MEASLES, MUMPS & RUBELLA VAC IJ SOLR: 0.5000 mL | Freq: Once | INTRAMUSCULAR | Status: DC

## 2019-07-23 MED ORDER — NALBUPHINE HCL 10 MG/ML IJ SOLN: 5.0000 mg | INTRAMUSCULAR | Status: DC | PRN

## 2019-07-23 MED ORDER — SCOPOLAMINE 1 MG/3DAYS TD PT72: 1.0000 | MEDICATED_PATCH | Freq: Once | TRANSDERMAL | Status: DC

## 2019-07-23 MED ORDER — NALBUPHINE HCL 10 MG/ML IJ SOLN: 5.0000 mg | Freq: Once | INTRAMUSCULAR | Status: DC | PRN

## 2019-07-23 MED ORDER — TETANUS-DIPHTH-ACELL PERTUSSIS 5-2.5-18.5 LF-MCG/0.5 IM SUSP: 0.5000 mL | Freq: Once | INTRAMUSCULAR | Status: DC

## 2019-07-23 MED ORDER — DIBUCAINE (PERIANAL) 1 % EX OINT: 1.0000 "application " | TOPICAL_OINTMENT | CUTANEOUS | Status: DC | PRN

## 2019-07-23 MED ORDER — KETOROLAC TROMETHAMINE 30 MG/ML IJ SOLN
30.0000 mg | Freq: Four times a day (QID) | INTRAMUSCULAR | Status: AC | PRN
  Administered 2019-07-23 – 2019-07-24 (×2): 30 mg via INTRAVENOUS
  Filled 2019-07-23: qty 1

## 2019-07-23 MED ORDER — WITCH HAZEL-GLYCERIN EX PADS: 1.0000 "application " | MEDICATED_PAD | CUTANEOUS | Status: DC | PRN

## 2019-07-23 MED ORDER — PHENYLEPHRINE HCL-NACL 20-0.9 MG/250ML-% IV SOLN
INTRAVENOUS | Status: AC
  Filled 2019-07-23: qty 250

## 2019-07-23 MED ORDER — SOD CITRATE-CITRIC ACID 500-334 MG/5ML PO SOLN
30.0000 mL | Freq: Once | ORAL | Status: AC
  Administered 2019-07-23: 30 mL via ORAL

## 2019-07-23 MED ORDER — MORPHINE SULFATE (PF) 0.5 MG/ML IJ SOLN
INTRAMUSCULAR | Status: AC
  Filled 2019-07-23: qty 10

## 2019-07-23 MED ORDER — ONDANSETRON HCL 4 MG/2ML IJ SOLN
INTRAMUSCULAR | Status: DC | PRN
  Administered 2019-07-23: 4 mg via INTRAVENOUS

## 2019-07-23 MED ORDER — DIPHENHYDRAMINE HCL 25 MG PO CAPS: 25.0000 mg | ORAL_CAPSULE | ORAL | Status: DC | PRN

## 2019-07-23 MED ORDER — SENNOSIDES-DOCUSATE SODIUM 8.6-50 MG PO TABS
2.0000 | ORAL_TABLET | ORAL | Status: DC
  Administered 2019-07-23 – 2019-07-24 (×2): 2 via ORAL
  Filled 2019-07-23 (×2): qty 2

## 2019-07-23 MED ORDER — STERILE WATER FOR IRRIGATION IR SOLN
Status: DC | PRN
  Administered 2019-07-23: 1

## 2019-07-23 MED ORDER — DEXAMETHASONE SODIUM PHOSPHATE 4 MG/ML IJ SOLN
INTRAMUSCULAR | Status: DC | PRN
  Administered 2019-07-23: 4 mg via INTRAVENOUS

## 2019-07-23 MED ORDER — OXYCODONE-ACETAMINOPHEN 5-325 MG PO TABS
1.0000 | ORAL_TABLET | ORAL | Status: DC | PRN
  Administered 2019-07-24: 2 via ORAL
  Administered 2019-07-24 (×2): 1 via ORAL
  Administered 2019-07-25 (×4): 2 via ORAL
  Filled 2019-07-23 (×2): qty 1
  Filled 2019-07-23 (×5): qty 2

## 2019-07-23 MED ORDER — FAMOTIDINE 20 MG PO TABS
20.0000 mg | ORAL_TABLET | Freq: Once | ORAL | Status: AC
  Administered 2019-07-23: 20 mg via ORAL

## 2019-07-23 MED ORDER — PHENYLEPHRINE HCL-NACL 20-0.9 MG/250ML-% IV SOLN
INTRAVENOUS | Status: DC | PRN
  Administered 2019-07-23: 60 ug/min via INTRAVENOUS

## 2019-07-23 MED ORDER — FAMOTIDINE 20 MG PO TABS
ORAL_TABLET | ORAL | Status: AC
  Filled 2019-07-23: qty 1

## 2019-07-23 MED ORDER — MEPERIDINE HCL 25 MG/ML IJ SOLN: 6.2500 mg | INTRAMUSCULAR | Status: DC | PRN

## 2019-07-23 MED ORDER — SODIUM CHLORIDE 0.9 % IV SOLN
2.0000 g | INTRAVENOUS | Status: AC
  Administered 2019-07-23: 13:00:00 2 g via INTRAVENOUS

## 2019-07-23 MED ORDER — SOD CITRATE-CITRIC ACID 500-334 MG/5ML PO SOLN
ORAL | Status: AC
  Filled 2019-07-23: qty 30

## 2019-07-23 MED ORDER — FENTANYL CITRATE (PF) 100 MCG/2ML IJ SOLN: 25.0000 ug | INTRAMUSCULAR | Status: DC | PRN

## 2019-07-23 MED ORDER — MENTHOL 3 MG MT LOZG: 1.0000 | LOZENGE | OROMUCOSAL | Status: DC | PRN

## 2019-07-23 MED ORDER — SODIUM CHLORIDE 0.9 % IR SOLN
Status: DC | PRN
  Administered 2019-07-23: 1

## 2019-07-23 MED ORDER — ACETAMINOPHEN 325 MG PO TABS
650.0000 mg | ORAL_TABLET | ORAL | Status: DC | PRN
  Administered 2019-07-23 – 2019-07-25 (×2): 650 mg via ORAL
  Filled 2019-07-23 (×2): qty 2

## 2019-07-23 MED ORDER — NALOXONE HCL 4 MG/10ML IJ SOLN
1.0000 ug/kg/h | INTRAVENOUS | Status: DC | PRN
  Filled 2019-07-23: qty 5

## 2019-07-23 MED ORDER — OXYCODONE HCL 5 MG PO TABS: 5.0000 mg | ORAL_TABLET | Freq: Once | ORAL | Status: DC | PRN

## 2019-07-23 MED ORDER — SIMETHICONE 80 MG PO CHEW
80.0000 mg | CHEWABLE_TABLET | ORAL | Status: DC
  Administered 2019-07-23 – 2019-07-24 (×2): 80 mg via ORAL
  Filled 2019-07-23 (×2): qty 1

## 2019-07-23 MED ORDER — COCONUT OIL OIL: 1.0000 "application " | TOPICAL_OIL | Status: DC | PRN

## 2019-07-23 MED ORDER — SCOPOLAMINE 1 MG/3DAYS TD PT72
MEDICATED_PATCH | TRANSDERMAL | Status: AC
  Filled 2019-07-23: qty 1

## 2019-07-23 MED ORDER — KETOROLAC TROMETHAMINE 30 MG/ML IJ SOLN
30.0000 mg | Freq: Four times a day (QID) | INTRAMUSCULAR | Status: AC | PRN
  Administered 2019-07-23: 30 mg via INTRAMUSCULAR
  Filled 2019-07-23: qty 1

## 2019-07-23 MED ORDER — OXYTOCIN 40 UNITS IN NORMAL SALINE INFUSION - SIMPLE MED
INTRAVENOUS | Status: AC
  Filled 2019-07-23: qty 1000

## 2019-07-23 MED ORDER — NALOXONE HCL 0.4 MG/ML IJ SOLN: 0.4000 mg | INTRAMUSCULAR | Status: DC | PRN

## 2019-07-23 MED ORDER — ONDANSETRON HCL 4 MG/2ML IJ SOLN: 4.0000 mg | Freq: Three times a day (TID) | INTRAMUSCULAR | Status: DC | PRN

## 2019-07-23 MED ORDER — PRENATAL MULTIVITAMIN CH
1.0000 | ORAL_TABLET | Freq: Every day | ORAL | Status: DC
  Administered 2019-07-24 – 2019-07-25 (×2): 1 via ORAL
  Filled 2019-07-23 (×2): qty 1

## 2019-07-23 MED ORDER — OXYCODONE HCL 5 MG/5ML PO SOLN: 5.0000 mg | Freq: Once | ORAL | Status: DC | PRN

## 2019-07-23 MED ORDER — SIMETHICONE 80 MG PO CHEW
80.0000 mg | CHEWABLE_TABLET | Freq: Three times a day (TID) | ORAL | Status: DC
  Administered 2019-07-23 – 2019-07-25 (×7): 80 mg via ORAL
  Filled 2019-07-23 (×7): qty 1

## 2019-07-23 MED ORDER — SODIUM CHLORIDE 0.9% FLUSH: 3.0000 mL | INTRAVENOUS | Status: DC | PRN

## 2019-07-23 MED ORDER — IBUPROFEN 600 MG PO TABS
600.0000 mg | ORAL_TABLET | Freq: Four times a day (QID) | ORAL | Status: DC | PRN
  Administered 2019-07-24 – 2019-07-25 (×6): 600 mg via ORAL
  Filled 2019-07-23 (×6): qty 1

## 2019-07-23 MED ORDER — ZOLPIDEM TARTRATE 5 MG PO TABS: 5.0000 mg | ORAL_TABLET | Freq: Every evening | ORAL | Status: DC | PRN

## 2019-07-23 MED ORDER — FENTANYL CITRATE (PF) 100 MCG/2ML IJ SOLN
INTRAMUSCULAR | Status: DC | PRN
  Administered 2019-07-23: 15 ug via INTRATHECAL

## 2019-07-23 MED ORDER — PROMETHAZINE HCL 25 MG/ML IJ SOLN: 6.2500 mg | INTRAMUSCULAR | Status: DC | PRN

## 2019-07-23 MED ORDER — LACTATED RINGERS IV SOLN: INTRAVENOUS | Status: DC | PRN

## 2019-07-23 MED ORDER — ONDANSETRON HCL 4 MG/2ML IJ SOLN
INTRAMUSCULAR | Status: AC
  Filled 2019-07-23: qty 2

## 2019-07-23 SURGICAL SUPPLY — 29 items
CHLORAPREP W/TINT 26ML (MISCELLANEOUS) ×3 IMPLANT
CLAMP CORD UMBIL (MISCELLANEOUS) IMPLANT
CLOTH BEACON ORANGE TIMEOUT ST (SAFETY) ×3 IMPLANT
DRSG OPSITE POSTOP 4X10 (GAUZE/BANDAGES/DRESSINGS) ×3 IMPLANT
ELECT REM PT RETURN 9FT ADLT (ELECTROSURGICAL) ×3
ELECTRODE REM PT RTRN 9FT ADLT (ELECTROSURGICAL) ×1 IMPLANT
EXTRACTOR VACUUM M CUP 4 TUBE (SUCTIONS) IMPLANT
EXTRACTOR VACUUM M CUP 4' TUBE (SUCTIONS)
GLOVE BIOGEL PI IND STRL 7.0 (GLOVE) ×1 IMPLANT
GLOVE BIOGEL PI INDICATOR 7.0 (GLOVE) ×2
GLOVE SURG ORTHO 8.0 STRL STRW (GLOVE) ×3 IMPLANT
GOWN STRL REUS W/TWL LRG LVL3 (GOWN DISPOSABLE) ×6 IMPLANT
KIT ABG SYR 3ML LUER SLIP (SYRINGE) ×3 IMPLANT
NDL HYPO 25X5/8 SAFETYGLIDE (NEEDLE) ×1 IMPLANT
NEEDLE HYPO 25X5/8 SAFETYGLIDE (NEEDLE) ×6 IMPLANT
NS IRRIG 1000ML POUR BTL (IV SOLUTION) ×3 IMPLANT
PACK C SECTION WH (CUSTOM PROCEDURE TRAY) ×3 IMPLANT
PAD OB MATERNITY 4.3X12.25 (PERSONAL CARE ITEMS) ×3 IMPLANT
PENCIL SMOKE EVAC W/HOLSTER (ELECTROSURGICAL) ×3 IMPLANT
SUT MNCRL 0 VIOLET CTX 36 (SUTURE) ×3 IMPLANT
SUT MON AB 4-0 PS1 27 (SUTURE) ×3 IMPLANT
SUT MONOCRYL 0 CTX 36 (SUTURE) ×6
SUT PDS AB 1 CT  36 (SUTURE)
SUT PDS AB 1 CT 36 (SUTURE) IMPLANT
SUT VIC AB 1 CTX 36 (SUTURE)
SUT VIC AB 1 CTX36XBRD ANBCTRL (SUTURE) IMPLANT
TOWEL OR 17X24 6PK STRL BLUE (TOWEL DISPOSABLE) ×3 IMPLANT
TRAY FOLEY W/BAG SLVR 14FR LF (SET/KITS/TRAYS/PACK) ×3 IMPLANT
WATER STERILE IRR 1000ML POUR (IV SOLUTION) ×3 IMPLANT

## 2019-07-23 NOTE — Anesthesia Preprocedure Evaluation (Addendum)
Anesthesia Evaluation  Patient identified by MRN, date of birth, ID band Patient awake    Reviewed: Allergy & Precautions, NPO status , Patient's Chart, lab work & pertinent test results  History of Anesthesia Complications Negative for: history of anesthetic complications  Airway Mallampati: II  TM Distance: >3 FB Neck ROM: Full    Dental  (+) Dental Advisory Given, Teeth Intact   Pulmonary neg pulmonary ROS,    Pulmonary exam normal        Cardiovascular negative cardio ROS Normal cardiovascular exam     Neuro/Psych PSYCHIATRIC DISORDERS Anxiety negative neurological ROS     GI/Hepatic Neg liver ROS, GERD  Medicated,  Endo/Other  negative endocrine ROS  Renal/GU negative Renal ROS     Musculoskeletal negative musculoskeletal ROS (+)   Abdominal   Peds  (+) ATTENTION DEFICIT DISORDER WITHOUT HYPERACTIVITY Hematology  (+) anemia ,  Plt 173k    Anesthesia Other Findings Covid+ 12/16  Reproductive/Obstetrics (+) Pregnancy                            Anesthesia Physical Anesthesia Plan  ASA: II  Anesthesia Plan: Spinal   Post-op Pain Management:    Induction:   PONV Risk Score and Plan: 2 and Treatment may vary due to age or medical condition, Ondansetron and Scopolamine patch - Pre-op  Airway Management Planned: Natural Airway  Additional Equipment: None  Intra-op Plan:   Post-operative Plan:   Informed Consent: I have reviewed the patients History and Physical, chart, labs and discussed the procedure including the risks, benefits and alternatives for the proposed anesthesia with the patient or authorized representative who has indicated his/her understanding and acceptance.       Plan Discussed with: CRNA and Anesthesiologist  Anesthesia Plan Comments: (Labs reviewed, platelets acceptable. Discussed risks and benefits of spinal, including spinal/epidural hematoma,  infection, failed block, and PDPH. Patient expressed understanding and wished to proceed. )       Anesthesia Quick Evaluation

## 2019-07-23 NOTE — Transfer of Care (Signed)
Immediate Anesthesia Transfer of Care Note  Patient: Cheryl Christensen  Procedure(s) Performed: CESAREAN SECTION (N/A Abdomen)  Patient Location: PACU  Anesthesia Type:Spinal  Level of Consciousness: awake, alert  and oriented  Airway & Oxygen Therapy: Patient Spontanous Breathing  Post-op Assessment: Report given to RN and Post -op Vital signs reviewed and stable  Post vital signs: Reviewed and stable SaO2 96%  Last Vitals:  Vitals Value Taken Time  BP 104/59 07/23/19 1333  Temp    Pulse 81 07/23/19 1334  Resp 22 07/23/19 1334  SpO2 94 % 07/23/19 1334  Vitals shown include unvalidated device data.  Last Pain:  Vitals:   07/23/19 1057  TempSrc: Oral         Complications: No apparent anesthesia complications

## 2019-07-23 NOTE — Anesthesia Postprocedure Evaluation (Signed)
Anesthesia Post Note  Patient: Tanyla A Flemister  Procedure(s) Performed: CESAREAN SECTION (N/A Abdomen)     Patient location during evaluation: PACU Anesthesia Type: Spinal Level of consciousness: awake and alert Pain management: pain level controlled Vital Signs Assessment: post-procedure vital signs reviewed and stable Respiratory status: spontaneous breathing and respiratory function stable Cardiovascular status: blood pressure returned to baseline and stable Postop Assessment: spinal receding and no apparent nausea or vomiting Anesthetic complications: no    Last Vitals:  Vitals:   07/23/19 1430 07/23/19 1445  BP: 101/69 (!) 89/67  Pulse: 67 (!) 57  Resp: 17 14  Temp:    SpO2: 94% 95%    Last Pain:  Vitals:   07/23/19 1408  TempSrc: Oral   Pain Goal:    LLE Motor Response: Purposeful movement (07/23/19 1445)   RLE Motor Response: Purposeful movement (07/23/19 1445)       Epidural/Spinal Function Cutaneous sensation: Able to Discern Pressure (07/23/19 1445), Patient able to flex knees: No (07/23/19 1445), Patient able to lift hips off bed: No (07/23/19 1445), Back pain beyond tenderness at insertion site: No (07/23/19 1445), Progressively worsening motor and/or sensory loss: No (07/23/19 1445), Bowel and/or bladder incontinence post epidural: No (07/23/19 1445)  Beryle Lathe

## 2019-07-23 NOTE — Lactation Note (Signed)
This note was copied from a baby's chart. Lactation Consultation Note  Patient Name: Cheryl Christensen [id number]'[id number] Date: 07/23/2019 Reason for consult: Initial assessment;Breast augmentation;Term;1st time breastfeeding;Primapara  LC in to visit with P1 Mom of [redacted]w[redacted]d infant at 4 hrs old.  Mom was a primary C/S and baby is sound asleep STS on Mom's chest.   Baby was fussy in PACU, but baby was not able to settle down an latch to breast.    Mom has a history of breast augmentation, incision under breasts.  Mom reports breasts were normal in size and shape, just smaller.  Mom reports + breast changes in pregnancy.    Nipples are semi-flat/short shafted.  Areola compressible.  Reviewed breast massage and hand expression.  Glistening noted on right side only.    Hand pump brought in and demonstrated how to pre-pump to help prime the breast and every nipple.    Encouraged continued STS, and to call out for help when baby starts cueing.  Reviewed what to look for with feeding cues.   Parents with a lot of questions.    Lactation brochure left in room.  Mom aware of IP and OP lactation support available.    Interventions Interventions: Breast feeding basics reviewed;Assisted with latch;Skin to skin;Breast massage;Hand express;Pre-pump if needed;Support pillows;Position options;Shells;Hand pump  Lactation Tools Discussed/Used Tools: Shells;Pump Shell Type: Inverted Breast pump type: Manual WIC Program: No Pump Review: Setup, frequency, and cleaning;Milk Storage Initiated by:: Erby Pian RN IBCLC Date initiated:: 07/23/19   Consult Status Consult Status: Follow-up Date: 07/24/19 Follow-up type: In-patient    Judee Clara 07/23/2019, 5:36 PM

## 2019-07-23 NOTE — Op Note (Signed)
Cesarean Section Procedure Note  Pre-operative Diagnosis: IUP at 39 weeks, cesarean on maternal request  Post-operative Diagnosis: same  Surgeon: Turner Daniels   Assistants: French Ana, RNFA  Anesthesia: spinal  Procedure:  Low Segment Transverse cesarean section  Procedure Details  The patient was seen in the Holding Room. The risks, benefits, complications, treatment options, and expected outcomes were discussed with the patient.  The patient concurred with the proposed plan, giving informed consent.  The site of surgery properly noted/marked.. A Time Out was held and the above information confirmed.  After induction of anesthesia, the patient was draped and prepped in the usual sterile manner. A Pfannenstiel incision was made and carried down through the subcutaneous tissue to the fascia. Fascial incision was made and extended transversely. The fascia was separated from the underlying rectus tissue superiorly and inferiorly. The peritoneum was identified and entered. Peritoneal incision was extended longitudinally. The utero-vesical peritoneal reflection was incised transversely and the bladder flap was bluntly freed from the lower uterine segment. A low transverse uterine incision was made. Delivered from vertex presentation was a baby with Apgar scores of 8 at one minute and 9 at five minutes. After the umbilical cord was clamped and cut cord blood was obtained for evaluation. The placenta was removed intact and appeared normal. The uterine outline, tubes and ovaries appeared normal. The uterine incision was closed with running locked sutures of 0 monocryl and imbricated with 0 monocryl. Hemostasis was observed. Lavage was carried out until clear. The peritoneum was then closed with 0 monocryl and rectus muscles plicated in the midline.  After hemostasis was assured, the fascia was then reapproximated with running sutures of 0 Vicryl. Irrigation was applied and after adequate hemostasis was assured,  the skin was reapproximated with subcutaneous sutures using 4-0 monocryl.  Instrument, sponge, and needle counts were correct prior the abdominal closure and at the conclusion of the case. The patient received 2 grams cefotetan preoperatively.  Findings: Viable female  Estimated Blood Loss:  88         Specimens: Placenta was sent to labor and delivery         Complications:  None

## 2019-07-23 NOTE — H&P (Addendum)
Cheryl Christensen is a 34 y.o. female presenting for primary cesarean section.  Pregnancy complicated by COVID infection with recovery at 34 weeks.  Borderline gestational Diabetes.  GBS+. OB History    Gravida  2   Para      Term      Preterm      AB  1   Living        SAB  0   TAB  1   Ectopic      Multiple      Live Births             Past Medical History:  Diagnosis Date  . Anxiety   . GERD (gastroesophageal reflux disease)    Past Surgical History:  Procedure Laterality Date  . BREAST SURGERY     augmentation  . THERAPEUTIC ABORTION     Family History: family history includes Heart disease in her mother and paternal grandfather; Parkinson's disease in her maternal grandfather; Pulmonary fibrosis in her father; Thyroid disease in her mother. Social History:  reports that she has never smoked. She has never used smokeless tobacco. She reports current alcohol use. She reports that she does not use drugs.     Maternal Diabetes: No borderline test and normal random glucose levels Genetic Screening: Normal Maternal Ultrasounds/Referrals: Normal Fetal Ultrasounds or other Referrals:  None Maternal Substance Abuse:  No Significant Maternal Medications:  None Significant Maternal Lab Results:  Group B Strep positive Clinda resistant Other Comments:  None  Review of Systems History   Last menstrual period 10/15/2018. Exam Physical Exam  Prenatal labs: ABO, Rh: --/--/O POS, O POS Performed at [facility], [address], [place], [place] [address]  [phone]) Antibody: NEG (01/17 0853) Rubella: Immune (06/26 0000) RPR: NON REACTIVE (01/17 0853)  HBsAg: Negative (06/26 0000)  HIV: Non-reactive (06/26 0000)  GBS: Positive/-- (12/31 0000)   Assessment/Plan: IUP at 39 weeks Pt desires primary cesarean by maternal request Risks and benefits of C/S were discussed.  All questions were answered and informed consent was obtained.  Plan to proceed with  low segment transverse Cesarean Section.   Cheryl Christensen 07/23/2019, 10:14 AM  This patient has been seen and examined.   All of her questions were answered.  Labs and vital signs reviewed.  Informed consent has been obtained.  The History and Physical is current.

## 2019-07-23 NOTE — Anesthesia Procedure Notes (Signed)
Spinal  Patient location during procedure: OR Start time: 07/23/2019 12:41 PM End time: 07/23/2019 12:44 PM Staffing Performed: anesthesiologist  Anesthesiologist: Beryle Lathe, MD Preanesthetic Checklist Completed: patient identified, IV checked, risks and benefits discussed, surgical consent, monitors and equipment checked, pre-op evaluation and timeout performed Spinal Block Patient position: sitting Prep: DuraPrep Patient monitoring: heart rate, cardiac monitor, continuous pulse ox and blood pressure Approach: midline Location: L3-4 Injection technique: single-shot Needle Needle type: Pencan  Needle gauge: 24 G Additional Notes Consent was obtained prior to the procedure with all questions answered and concerns addressed. Risks including, but not limited to, bleeding, infection, nerve damage, paralysis, failed block, inadequate analgesia, allergic reaction, high spinal, itching, and headache were discussed and the patient wished to proceed. Functioning IV was confirmed and monitors were applied. Sterile prep and drape, including hand hygiene, mask, and sterile gloves were used. The patient was positioned and the spine was prepped. The skin was anesthetized with lidocaine. Free flow of clear CSF was obtained prior to injecting local anesthetic into the CSF. The spinal needle aspirated freely following injection. The needle was carefully withdrawn. The patient tolerated the procedure well.   Leslye Peer, MD

## 2019-07-24 LAB — CBC
HCT: 28.7 % — ABNORMAL LOW (ref 36.0–46.0)
Hemoglobin: 9.4 g/dL — ABNORMAL LOW (ref 12.0–15.0)
MCH: 29.7 pg (ref 26.0–34.0)
MCHC: 32.8 g/dL (ref 30.0–36.0)
MCV: 90.5 fL (ref 80.0–100.0)
Platelets: 160 10*3/uL (ref 150–400)
RBC: 3.17 MIL/uL — ABNORMAL LOW (ref 3.87–5.11)
RDW: 13.5 % (ref 11.5–15.5)
WBC: 12.9 10*3/uL — ABNORMAL HIGH (ref 4.0–10.5)
nRBC: 0 % (ref 0.0–0.2)

## 2019-07-24 LAB — BIRTH TISSUE RECOVERY COLLECTION (PLACENTA DONATION)

## 2019-07-24 NOTE — Lactation Note (Signed)
This note was copied from a baby's chart. Lactation Consultation Note  Patient Name: Cheryl Christensen UCLTV'T Date: 07/24/2019 Reason for consult: Follow-up assessment;Difficult latch Baby is 25 hours old.  Newborn continues to have difficulty with latching.  Mom is pumping with DEBP and hand expressing small amounts of colostrum.  RN recently initiated a nipple shield but baby was sleepy.  Baby is currently sleeping in crib.  Parents will watch for feeding cues and call for LC assist.  Maternal Data    Feeding Feeding Type: Breast Fed  LATCH Score Latch: Too sleepy or reluctant, no latch achieved, no sucking elicited.  Audible Swallowing: None  Type of Nipple: Flat  Comfort (Breast/Nipple): Soft / non-tender  Hold (Positioning): Assistance needed to correctly position infant at breast and maintain latch.  LATCH Score: 4  Interventions    Lactation Tools Discussed/Used Tools: Nipple Shields Nipple shield size: 20   Consult Status Consult Status: Follow-up Date: 07/25/19 Follow-up type: In-patient    Huston Foley 07/24/2019, 2:40 PM

## 2019-07-24 NOTE — Progress Notes (Signed)
POD # 2  Doing well No complaints. BP 103/66 (BP Location: Left Arm)   Pulse 62   Temp 98.2 F (36.8 C) (Oral)   Resp 16   Ht 5\' 6"  (1.676 m)   Wt 81.6 kg   LMP 10/15/2018   SpO2 98%   Breastfeeding Unknown   BMI 29.05 kg/m  Results for orders placed or performed during the hospital encounter of 07/23/19 (from the past 24 hour(s))  CBC     Status: Abnormal   Collection Time: 07/24/19  4:59 AM  Result Value Ref Range   WBC 12.9 (H) 4.0 - 10.5 K/uL   RBC 3.17 (L) 3.87 - 5.11 MIL/uL   Hemoglobin 9.4 (L) 12.0 - 15.0 g/dL   HCT 07/26/19 (L) 86.7 - 51.9 %   MCV 90.5 80.0 - 100.0 fL   MCH 29.7 26.0 - 34.0 pg   MCHC 32.8 30.0 - 36.0 g/dL   RDW 82.4 29.9 - 80.6 %   Platelets 160 150 - 400 K/uL   nRBC 0.0 0.0 - 0.2 %  Collect bld for placenta donatation     Status: None   Collection Time: 07/24/19  4:59 AM  Result Value Ref Range   Placenta donation bld collect Collected by Laboratory    Abdomen is soft and non tender Bandage clean and dry   POD # 2  Doing well Routine care

## 2019-07-24 NOTE — Lactation Note (Addendum)
This note was copied from a baby's chart. Lactation Consultation Note  Patient Name: Girl Jalie Eiland GOTLX'B Date: 07/24/2019 Reason for consult: Mother's request;Difficult latch;Follow-up assessment P1, 11 hour female infant. LC changed stool large ( meconium) while in room, infant had two stools since birth. Tools given: breast shells and hand pump to help evert nipple shaft out more due to having inverted and short shafted nipples,   mom with hx of breast augmentation. LC had mom pre-pump breast with hand pump prior to latching infant at breast, mom attempted to latch infant on left breast using the football hold, infant suckle less than 2 minutes but mostly held breast in mouth. Mom used hand pump and easily expressed 7 mls of colostrum that was spoon feed to infant. Dad did STS with infant afterwards. Mom breast responds well to stimulation and pre-pumping mom, will continue to work with infant latching at breast. Mom knows to hand express or use hand pump and give infant back volume if infant doesn't latch. Parents will continue to do as much STS as possible with infant. Mom knows to breastfeed according hunger cues, 8 to 12 times within 24 hours and on demand. Mom knows to call RN or LC if she needs assistance with latching infant at breast.    Maternal Data    Feeding    LATCH Score Latch: Repeated attempts needed to sustain latch, nipple held in mouth throughout feeding, stimulation needed to elicit sucking reflex.  Audible Swallowing: A few with stimulation  Type of Nipple: Inverted  Comfort (Breast/Nipple): Soft / non-tender  Hold (Positioning): Assistance needed to correctly position infant at breast and maintain latch.  LATCH Score: 5  Interventions Interventions: Adjust position;Support pillows;Skin to skin;Position options;Expressed milk;Pre-pump if needed;Hand pump  Lactation Tools Discussed/Used Tools: Pump   Consult Status Consult Status:  Follow-up Date: 07/24/19 Follow-up type: In-patient    Danelle Earthly 07/24/2019, 12:38 AM

## 2019-07-24 NOTE — Lactation Note (Signed)
This note was copied from a baby's chart. Lactation Consultation Note  Patient Name: Cheryl Christensen Date: 07/24/2019 Reason for consult: Follow-up assessment;Mother's request;Difficult latch;Primapara;1st time breastfeeding;Term  1550 - 1650 - I followed up with Cheryl Christensen upon request. Baby [name] has been latching to the breast briefly, per mom. Cheryl Christensen states that she has not had a sustained latch in the first  24 hours; she typically grasps the breasts, suckles a few times, and releases and sleeps.  I reviewed hand expression, and we noted colostrum. I assisted with placing baby on the right breast in cradle hold. I allowed baby to suckle on a gloved finger; weak suck noted. Baby was awake during this attempt, but she promptly closed her mouth at the breast and went to sleep. We tried again with a size 20 nipple shield, and she opened her mouth and held the shield without any suckles. I directed Cheryl Christensen to allow her to hold the shield for a few minutes while skin to skin.  Pecola Leisure is now 51-28 hours old. We discussed supplementation as a way to increase baby's energy at the breast. Cheryl Christensen and her support person agreed to try donor breast milk.  I directed Cheryl Christensen to pump while I showed her support person how to finger feed baby 10 mls of donor milk by syringe. We then fed baby Cheryl Christensen's pumped colostrum (about 1 ml) by foley cup. Her support person was able to demonstrate this.  The plan for tonight is to try to breast feed baby on demand 8-12 times a day, always offering the breast first. If baby does not have a good sustained latch with that feeding, I recommended supplementing 10-15 mls donor breast milk via syringe or cup and pumping. Feed baby any EBM as well. Milk storage guidelines reviewed. Follow up tomorrow for latch check.   I also agreed to put in an OP consult referral.  Maternal Data Has patient been taught Hand Expression?: Yes Does the patient have  breastfeeding experience prior to this delivery?: No  Feeding    LATCH Score Latch: Too sleepy or reluctant, no latch achieved, no sucking elicited.  Audible Swallowing: None  Type of Nipple: Everted at rest and after stimulation(short)  Comfort (Breast/Nipple): Soft / non-tender  Hold (Positioning): Assistance needed to correctly position infant at breast and maintain latch.  LATCH Score: 5  Interventions Interventions: Breast feeding basics reviewed;Assisted with latch;Hand express;Adjust position;Support pillows;DEBP;Skin to skin  Lactation Tools Discussed/Used Tools: Nipple Shields Nipple shield size: 20 Breast pump type: Double-Electric Breast Pump Pump Review: Setup, frequency, and cleaning;Milk Storage Initiated by:: hl Date initiated:: 07/24/19   Consult Status Consult Status: Follow-up Date: 07/25/19 Follow-up type: In-patient    Walker Shadow 07/24/2019, 5:10 PM

## 2019-07-24 NOTE — Progress Notes (Signed)
MOB was referred for history of depression/anxiety. * Referral screened out by Clinical Social Worker because none of the following criteria appear to apply: ~ History of anxiety/depression during this pregnancy, or of post-partum depression following prior delivery. ~ Diagnosis of anxiety and/or depression within last 3 years OR * MOB's symptoms currently being treated with medication and/or therapy.    Please contact the Clinical Social Worker if needs arise, by MOB request, or if MOB scores greater than 9/yes to question 10 on Edinburgh Postpartum Depression Screen.   Therisa Mennella S. Ripley Lovecchio, MSW, LCSW [facility] [phone]    

## 2019-07-25 MED ORDER — OXYCODONE HCL 5 MG PO TABS: 5.0000 mg | ORAL_TABLET | ORAL | 0 refills | Status: AC | PRN

## 2019-07-25 MED ORDER — ACETAMINOPHEN 325 MG PO TABS: 650.0000 mg | ORAL_TABLET | Freq: Four times a day (QID) | ORAL | 1 refills | Status: AC | PRN

## 2019-07-25 MED ORDER — IBUPROFEN 600 MG PO TABS: 600.0000 mg | ORAL_TABLET | Freq: Four times a day (QID) | ORAL | 1 refills | Status: AC | PRN

## 2019-07-25 NOTE — Lactation Note (Signed)
This note was copied from a baby's chart. Lactation Consultation Note  Patient Name: Cheryl Christensen LNLGX'Q Date: 07/25/2019 Reason for consult: Follow-up assessment;Difficult latch;Primapara;Term Baby is 46 hours old/8% weight loss.  Mom has been breastfeeding using a nipple shield but having some pain with latch.  She has used both a 20 mm and 24 mm shield.  Parents are supplementing with donor milk at breast using a curved tip syringe.  They are having some difficulty with this.  Baby is getting 10 mls per feeding.  A lot of teaching done.  Reviewed plan for discharge.  She is also pumping with DEBP.  Mom currently attempting to wake baby for feeding.  Baby is in football hold but another pillow added for height.  Mom has flat nipples.  24 mm nipple shield applied but too large and falling off.  Discussed using a 5 french feeding tube at breast to supplement and parents agreeable.  Feeding tube taped to breast and placed under 20 mm nipple shield.  Shield filled with donor milk.  Baby opened wide and latched well.  Baby fed for 20 minutes and took 24 mls of donor milk.  Baby relaxed and sleepy after feed.  Mom was comfortable with feeding.  Reviewed supplementation volumes per day off life.  Parents know they will have to use formula at home to supplement.  Instructed when not using 5 french at breast to use a wide based nipple/bottle.  Discussed milk coming to volume and the prevention and treatment of engorgement.  Mom has a breast pump.  Plan for discharge is to feed with cues or 8-12 times/24 hours, supplement with formula/expressed breast milk per day of life volumes with feeding tube at breast or bottle, pump both breasts every 3 hours.  Recommending making an outpatient lactation appointment mid next week.  Questions answered.  Encouraged to call for assist prn.  Maternal Data    Feeding Feeding Type: Breast Fed  LATCH Score Latch: Grasps breast easily, tongue down, lips flanged,  rhythmical sucking.(with nipple shield)  Audible Swallowing: Spontaneous and intermittent  Type of Nipple: Flat  Comfort (Breast/Nipple): Soft / non-tender  Hold (Positioning): Assistance needed to correctly position infant at breast and maintain latch.  LATCH Score: 8  Interventions Interventions: Adjust position;DEBP;Assisted with latch;Support pillows;Skin to skin;Breast massage;Breast compression  Lactation Tools Discussed/Used Tools: Nipple Shields;109F feeding tube / Syringe Nipple shield size: 20   Consult Status Consult Status: Complete Follow-up type: Call as needed    Huston Foley 07/25/2019, 11:45 AM

## 2019-07-25 NOTE — Discharge Summary (Signed)
Obstetric Discharge Summary Reason for Admission: cesarean section Prenatal Procedures: none Intrapartum Procedures: cesarean: low cervical, transverse Postpartum Procedures: none Complications-Operative and Postpartum: none Hemoglobin  Date Value Ref Range Status  07/24/2019 9.4 (L) 12.0 - 15.0 g/dL Final   HCT  Date Value Ref Range Status  07/24/2019 28.7 (L) 36.0 - 46.0 % Final    Physical Exam:  General: alert, cooperative and no distress Lochia: appropriate Uterine Fundus: firm Incision: healing well DVT Evaluation: No evidence of DVT seen on physical exam.  Discharge Diagnoses: Term Pregnancy-delivered  Discharge Information: Date: 07/25/2019 Activity: pelvic rest Diet: routine Medications: PNV, Ibuprofen and oxycodone Condition: stable Instructions: refer to practice specific booklet Discharge to: home   Newborn Data: Live born female  Birth Weight: 8 lb 2.2 oz (3690 g) APGAR: 8, 9  Newborn Delivery   Birth date/time: 07/23/2021 13:02:00 Delivery type: C-Section, Low Transverse Trial of labor: No C-section categorization: Primary      Home with mother.  Cheryl Christensen 07/25/2019, 7:14 AM

## 2019-08-01 ENCOUNTER — Ambulatory Visit: Payer: Self-pay

## 2019-08-01 NOTE — Lactation Note (Signed)
This note was copied from a baby's chart. Lactation Consultation Note  Patient Name: Cheryl Christensen IWLNL'G Date: 08/01/2019     Maternal Data    Feeding  [name] breastfed using a nipple shield on both breast for 35-40 minutes. Weight before feeding was 3416 grams (7 lbs 8.4 oz). [name] took 42 mL from the left breast and 20 mL from the right breast for a total of 62 mL. [name] was able to easily latch on to the nipple shield.  LATCH Score                   Interventions   Used nipple shield to assist with latch. Pre and post feeding weight was obtained. Used cross cradle position and foot ball position.   Lactation Tools Discussed/Used  Nipple shield, Double electric breast pump   Consult Status  [name] and parents are here today for an outpatient lactation consultation. Mother states that [name] did not latch well while at the hospital despite using a nipple shield. After assisting parents with positioning. Parents instructed to experiment with different positions to see what worked best but [name] seemed to do well in the cross cradle position and football. Parents instructed to continue to use nipple shield for at least 2-3 more weeks and start to try some feedings without the shield when [name] is more efficient at nursing. Mother should pump if still feeling full after breastfeeding but doesn't have to pump each time. Parents will call lactation if experiencing any difficulties.    Arlyss Gandy 08/01/2019, 2:33 PM

## 2020-11-20 ENCOUNTER — Telehealth: Payer: BLUE CROSS/BLUE SHIELD

## 2020-11-20 NOTE — Telephone Encounter
Call Back Request      Reason for call back: Pt. Interested in establishing care with Dr. Harrold Donath for New OB apt.    CBN: [phone]    Any Symptoms:  []  Yes  []  No       If yes, what symptoms are you experiencing:    o Duration of symptoms (how long):    o Have you taken medication for symptoms (OTC or Rx):      Patient or caller has been notified of the 24-48 hour turnaround time.

## 2020-11-20 NOTE — Telephone Encounter
[place] OB [place] About 33 weeks. Advised to have records faxed to our office for MD to review in order to determine if able to accept transfer of care.   Pt will have records faxed.     Insurance:  [employer] multi-plan  Subscriber: Spouse-Bennett Allred  BB:[id number]  Group: [id number]    States in a month will be switching insurance to [employer]

## 2020-11-26 NOTE — Telephone Encounter
Call Back Request      Reason for call back: Pt following up on new ob appt would like to know if office received her medical records?    Please assist.    Any Symptoms:  []  Yes  [x]  No       If yes, what symptoms are you experiencing:    o Duration of symptoms (how long):    o Have you taken medication for symptoms (OTC or Rx):      Patient or caller has been notified of the 24 hour turnaround time.

## 2020-11-26 NOTE — Telephone Encounter
Spoke to patient following up with other OB for records.     Will call to follow up.    [name]

## 2020-11-26 NOTE — Telephone Encounter
Forwarded by: Tamsin Nader THERESA Shakila Mak

## 2020-11-27 NOTE — Telephone Encounter
Ok to accept. When does she need to be seen? Do I have a new OB spot you can put her in?   THanks so much!!

## 2020-11-27 NOTE — Telephone Encounter
Good Afternoon,     I just uploaded outside records for this patient.    Can you review and let me know if ok to schedule?     Thank you,     [name]

## 2020-12-01 NOTE — Telephone Encounter
Called pt, she informed me that her last appt with her previous ob/gyn was on 5/4, EDD is 7/4, she had a scheduled c-section for 6/28, I don't see any openings for nob w/ you within the next few weeks, are you able to accommodate her sooner? Thanks!

## 2020-12-02 NOTE — Telephone Encounter
Forwarded by: Thomasene Dubow M Trey Gulbranson

## 2020-12-02 NOTE — Telephone Encounter
PDL Call to Practice    Reason for Call: Pt is returning Ashley's missed call- she stated she had left a message for her to call back to gather some things.    Appointment Related?  [x]  Yes  []  No     If yes;  Date: 12/03/20  Time: 7:15 am    Call warm transferred to PDL: [x]  Yes  []  No    Call Received by Practice Representative:  

## 2020-12-02 NOTE — Telephone Encounter
She needs to be seen now. Please see if she can come in at 7:15 tomorrow am and I will fit her in before my 9 am commitment. Thanks.

## 2020-12-02 NOTE — Telephone Encounter
Patient coming in for NEW OB appt tomorrow. No further action required.     Tina Oneal

## 2020-12-03 ENCOUNTER — Ambulatory Visit: Payer: BLUE CROSS/BLUE SHIELD

## 2020-12-03 ENCOUNTER — Other Ambulatory Visit: Payer: BLUE CROSS/BLUE SHIELD

## 2020-12-03 ENCOUNTER — Telehealth: Payer: BLUE CROSS/BLUE SHIELD

## 2020-12-03 DIAGNOSIS — N911 Secondary amenorrhea: Secondary | ICD-10-CM

## 2020-12-03 DIAGNOSIS — O34219 Maternal care for unspecified type scar from previous cesarean delivery: Secondary | ICD-10-CM

## 2020-12-03 NOTE — Telephone Encounter
Taken care of. No further action required.     [name]

## 2020-12-03 NOTE — H&P
PATIENT: Tina Oneal  MRN: [id number]  DOB: [birth year]  DATE OF SERVICE: 12/03/2020    REFERRING PRACTITIONER: No ref. provider found  PRIMARY CARE PROVIDER: No primary care provider on file.    REASON FOR REFERRAL: transfer of OB care    Chief Complaint: same    Subjective:      Tina Oneal is a 35 y.o. G3P1011 who presents for new OB visit at 35+3 weeks. H/O previous CD. Good FM. NIPT female. No bleeding, discharge or pain. The patient has no other complaints today.     Review of Systems:  A 14-system review of systems was performed and is negative except as stated in the history of present illness.     Past Gynecologic History:  Menarche: 11  Cycle interval: 28 Duration: 6 Dysmenorrhea: yes   Last pap smear: 3/22  no history of abnormal pap smears  Last mammogram: none  STDs: HSV   Sexually active?: yes, female.     Past Obstetrical History:  G3P1011  1 TAB  2020 PLTCS elective F 8lb2oz - postpartum anxiety.       Past Medical History: No past medical history on file.     Past Surgical History:   Past Surgical History:   Procedure Laterality Date   ? BREAST AUGMENTATION         Medications:   Current Outpatient Medications   Medication Sig   ? Prenatal Vit-Fe Fumarate-FA (PRENATAL VITAMIN) 27-0.8 MG TABS Take 1 tablet by mouth.     No current facility-administered medications for this visit.     Allergies: No Known Allergies      Social History:  Social History     Tobacco Use   ? Smoking status: Never Smoker   Substance Use Topics   ? Alcohol use: Not Currently   ? Drug use: Never     Lives with husband and daughter in [place]. Moved from [facility] for husband work. Works as [employer]. No history of abuse or violence.    Family History: No family history on file.  FOB cousin had baby with genetic issue  No cancer  No DM    Objective:     Vitals:    12/03/20 0708   BP: 100/66   Pulse: 88   Temp: 37.1 ?C (98.7 ?F)   Weight: 178 lb (80.7 kg)     There is no height or weight on file to calculate BMI.    Gen: NAD, pleasant, conversant  Scan today  Adeq AFI, vtx, 6lb1oz  FHT 145 bpm    Lab Review:    O pos  VDRL neg  Ucx neg  Hep B neg  HIV neg  GCCT neg  Rub Imm  MSAFP neg  Hep C neg  Genetics neg  Tdap done per pt   1hr PG wnl per pt    Assessment:     35 y.o. G3P1 presents for transfer of OB care at 35+3 weeks    Plan/ Recommendation:   1. PNC: OB folder and LR folder given. Explained group practice model and reviewed information on safe medications in pregnancy, weight gain, foods to avoid, exercise, mercury risk, listeriosis, etc.    - Labs entered into computer   - Pap UTD     - NIPT wnl - XX  AFP wnl  Anatomy scan wnl  Tdap done  1hr PG wnl  GBS done today  2. HSV: declines ppx. Discussed in detail. Will let me know. States only one outbreak  in 2007  3. H/O previous CD: will need CD scheduled. Will be with another MD as I am out of town that week. Will schedule for 6/27.   PTL prec  F/U 2 weeks  Tina Oneal    45 minutes were spent personally by me today on this encounter which include today's pre-visit review of the chart, obtaining appropriate history, performing an evaluation, documentation and discussion of management with details supported within the note for today's visit. The time documented was exclusive of any time spent on the separately billed procedure.

## 2020-12-03 NOTE — Telephone Encounter
6/16 ok to double book at 1 pm. thanks

## 2020-12-03 NOTE — Telephone Encounter
Sent pt a mychart msg informing of appt on 6/16 with Dr. Harrold Donath.

## 2020-12-03 NOTE — Telephone Encounter
ROB- needs an appt with you in 2 weeks, no openings, are you able to accommodate her? Thanks!

## 2020-12-04 ENCOUNTER — Ambulatory Visit: Payer: BLUE CROSS/BLUE SHIELD

## 2020-12-04 ENCOUNTER — Telehealth: Payer: BLUE CROSS/BLUE SHIELD

## 2020-12-05 LAB — BETA STREP B GENITAL PC: BETA STREP B GENITAL PCR: NOT DETECTED

## 2020-12-10 ENCOUNTER — Ambulatory Visit: Payer: BLUE CROSS/BLUE SHIELD

## 2020-12-10 ENCOUNTER — Telehealth: Payer: BLUE CROSS/BLUE SHIELD

## 2020-12-10 NOTE — Telephone Encounter
Cld L&D sw Diane booked c/s w Dr Shannan Harper for 6/27 at 10 am    Will send [employer] w info    Sw pt confirmed c/s booked

## 2020-12-11 NOTE — Telephone Encounter
Great. Thanks. Please schedule pt for one ROB visit with Shannan Harper the week before the CD. She needs a visit that week anyway. Thanks.

## 2020-12-17 ENCOUNTER — Ambulatory Visit: Payer: BLUE CROSS/BLUE SHIELD

## 2020-12-17 ENCOUNTER — Telehealth: Payer: BLUE CROSS/BLUE SHIELD

## 2020-12-17 DIAGNOSIS — O34219 Maternal care for unspecified type scar from previous cesarean delivery: Secondary | ICD-10-CM

## 2020-12-17 NOTE — Telephone Encounter
Per Dr.Nathan, wanted pt to see you next week because you will be doing her c-section. Scheduled pt for 6/21 at 10:45am. Pt stated that, that day and time is very hard for her to make and wanted to see if you'd be able to accommodate her for Friday 6/24. Please advise if this is possible.    Thanks!

## 2020-12-17 NOTE — Patient Instructions
Pediatrician Referral List  [place]  [address]  [place], [place] [address]  Phone: [phone]  [email or web address]    [place]  [address]  [place], [place] [address]  Phone: [phone]  [email or web address]    [place]  [place] [facility] [place]  [address]  [place], [place] [address]  Phone: [phone]  [email or web address]    [place]  [address]  [place], [place] [address]  Phone: [phone]  [email or web address]    Office Listed below are not [facility]    [facility] [phone]  [address], [place]    [facility] [phone]  [address], [place]    [facility] [phone]  [address], [place]    [facility] [phone]  [address], [place]    [facility] [phone]  [address], [place]    [facility] [phone]  [address], [place]    [facility] [phone]  [address], [place]  (Also with an [place] office)    [facility] [phone]  [address], [place]    [facility] [phone]  [address], [place]  (Also with a [place] Office)

## 2020-12-17 NOTE — Progress Notes
S: good FM. No bleeding, LOF. No ctx.     35 y.o. G3P1 presents for transfer of OB care at 37+3 weeks    1. PNC: labs UTD and reviewed              - NIPT wnl - XX  AFP wnl  Anatomy scan wnl  Tdap done  1hr PG wnl  GBS neg  2. H/O HPV: plan for pap PP  3. H/O previous CD: scheduled for 6/27 with Shannan Harper   Plans:   Pre-reg: will do  Peds: will give list  BF: will try  labor prec  F/U 1 weeks - meet with Shannan Harper next visit  Tina Oneal  20 minutes were spent personally by me today on this encounter which include today's pre-visit review of the chart, obtaining appropriate history, performing an evaluation, documentation and discussion of management with details supported within the note for today's visit. The time documented was exclusive of any time spent on the separately billed procedure.

## 2020-12-17 NOTE — Telephone Encounter
Spoke with pt and rescheduled appt to 12/25/20 at 12:45pm with Dr.Ferro

## 2020-12-17 NOTE — Telephone Encounter
1245 Friday is fine

## 2020-12-22 ENCOUNTER — Ambulatory Visit: Payer: BLUE CROSS/BLUE SHIELD

## 2020-12-25 ENCOUNTER — Ambulatory Visit: Payer: BLUE CROSS/BLUE SHIELD

## 2020-12-25 DIAGNOSIS — Z3483 Encounter for supervision of other normal pregnancy, third trimester: Secondary | ICD-10-CM

## 2020-12-25 NOTE — Progress Notes
[facility]  Return OB Visit    Chief Complaint:  Return OB visit    S: Patient denies contractions, vaginal bleeding, loss of fluid and reports good fetal movement.    O:   Vitals:    12/25/20 1257   BP: 102/64   Pulse: 91   Weight: 179 lb (81.2 kg)       Name/Gender - Female  Peds - Publishing copy - done  Sterilization - No  Insurance - [employer] PPO    Labs:  Rubella: Immune  Hep B: neg  Blood type: O+  Tdap: done  GBS: neg  COVID: s/p vaccine and booster    A/P:  35 y.o. G3P1 presents at 38.4 weeks for return OB visit    1. Prenatal care: routine care  - prenatal labs UTD in media  - NIPT 46XX  - AFP neg  2. H/o CD x 1  - will repeat 12/28/20 10:00  - sterilization No  3. H/o HPV  - will need PP pap    Plans:    Follow up 6/27    Maceo Pro. Brittanyann Wittner     20 minutes spent preparing to see the patient, obtaining/reviewing separately obtained history, performing physical exam/evaluation, counseling of patient, documenting in EHR.

## 2020-12-29 ENCOUNTER — Inpatient Hospital Stay (HOSPITAL_COMMUNITY): Admit: 2020-12-29 | Payer: 59 | Admitting: Obstetrics and Gynecology

## 2020-12-29 ENCOUNTER — Encounter (HOSPITAL_COMMUNITY): Payer: Self-pay

## 2020-12-29 SURGERY — Surgical Case
Anesthesia: Regional

## 2021-01-07 ENCOUNTER — Telehealth: Payer: BLUE CROSS/BLUE SHIELD

## 2021-01-07 NOTE — Telephone Encounter
Call Back Request      Reason for call back: Pt delivered last week and is wondering when can she take off bandage, is requesting a c/b to speak with a MA or nurse.    Any Symptoms:  []  Yes  [x]  No       If yes, what symptoms are you experiencing:    o Duration of symptoms (how long):    o Have you taken medication for symptoms (OTC or Rx):      Patient or caller has been notified of the 24-48 hour turnaround time.

## 2021-01-07 NOTE — Telephone Encounter
Forwarded by: Lorriane Shire Crawford Givens  She had a C/S. If her steri strips are peeling off, totally fine to take them off in the shower. If not, we will remove them at her appt. On 7/11. : )

## 2021-01-07 NOTE — Telephone Encounter
Spoke with patient and notified her that it is fine to take them off or we can remove them on 01/11/21.

## 2021-01-11 ENCOUNTER — Telehealth: Payer: BLUE CROSS/BLUE SHIELD

## 2021-01-11 ENCOUNTER — Ambulatory Visit: Payer: BLUE CROSS/BLUE SHIELD

## 2021-01-13 NOTE — Telephone Encounter
I think Shannan Harper did her CD. Does she have any availability? Otherwise, I can try to find something the following week. Thanks.

## 2021-01-13 NOTE — Telephone Encounter
Spoke with pt and scheduled appt with Dr.Ferro for 8/15 at 1:30

## 2021-01-13 NOTE — Telephone Encounter
Lauren is fine. Thanks.

## 2021-01-13 NOTE — Telephone Encounter
Dr. Shannan Harper is also out of office that week, should she schedule with Lauren?

## 2021-02-15 ENCOUNTER — Ambulatory Visit: Payer: BLUE CROSS/BLUE SHIELD

## 2021-02-15 NOTE — Addendum Note
Addended by: Ariea Rochin M on: 02/15/2021 03:40 PM     Modules accepted: Orders

## 2021-02-15 NOTE — Progress Notes
[facility]  Postpartum Visit    Chief Complaint: 6 week postpartum check    S: Patient doing well no pain, no depression, no lochia. Depression score 9    O:  Vitals:    02/15/21 1336   BP: 97/65   Pulse: 88   Weight: 157 lb (71.2 kg)   Height: 5' 6'' (1.676 m)     Body mass index is 25.34 kg/m.      Gen: NAD  Abd: soft, NT,ND  Pelvic:  EGBUS: normal anatomy, perineum healed well  Vagina: no lesion no discharge  Cervix: no lesions healed well  BME: mobile NT uterus no adnexal masses noted  Extremities: no c/c/e    A/P: 35 y.o. G2P2002 presents for postpartum visit  - s/p CD delivery, well healing  - birth control: condoms  - baby: bonding well  - breastfeeding: yes  - pap smear: today    Return to office in 1 year for annual exam.    Artist Pais, M.D.

## 2021-02-19 LAB — HPV DNA PCR: HPV TYPE 16: NEGATIVE

## 2021-02-21 LAB — Liquid-based pap smear: LAB AP CASE REPORT: NEGATIVE

## 2022-05-31 ENCOUNTER — Ambulatory Visit: Payer: BLUE CROSS/BLUE SHIELD | Attending: Family

## 2022-05-31 ENCOUNTER — Telehealth: Payer: BLUE CROSS/BLUE SHIELD

## 2022-05-31 DIAGNOSIS — Z01419 Encounter for gynecological examination (general) (routine) without abnormal findings: Secondary | ICD-10-CM

## 2022-05-31 NOTE — Telephone Encounter
Call Back Request      Reason for call back: Per pt, she is returning a call from the office. Per pt, they asked if she could arrive at 4:30pm instead of her appt. Pt can arrive earlier. No encounter in CC.    CBN: [phone]    Any Symptoms:  []  Yes  [x]  No       If yes, what symptoms are you experiencing:    o Duration of symptoms (how long):    o Have you taken medication for symptoms (OTC or Rx):      If call was taken outside of clinic hours:    [] Patient or caller has been notified that this message was sent outside of normal clinic hours.     [] Patient or caller has been warm transferred to the physician's answering service. If applicable, patient or caller informed to please call back if symptoms progress.  Patient or caller has been notified of the turnaround time of 1-2 business day(s).

## 2022-05-31 NOTE — Telephone Encounter
Spoke with pt and confirmed 4:30 time for todays appt

## 2022-06-01 NOTE — H&P
[facility]  Gynecology/Annual Visit     PATIENT: Tina Oneal  MRN: [id number]  DOB: Jul 26, 1985  DATE OF SERVICE: 05/31/2022    REFERRING PRACTITIONER: Referral, Self  PRIMARY CARE PROVIDER: Pcp, No, MD    Chief Complaint:   Chief Complaint   Patient presents with   ? Annual Exam       Subjective:      Tina Oneal is a 36 y.o. [id number] who presents for well woman appt. Pt. Stays at home with 2 daughters, lives in [place]. Stopped breastfeeding, experiencing regular menses - trying to conceive. Last pap smear was in 02/2021: normal/HPV negative.     Review of Systems:  A 14-system review of systems was performed and is negative except as stated in the history of present illness.     Past Gynecologic History:  Menses Regular, every 28-30 days  LPS 02/2021: normal/HPV negative, hx of HPV, last abnormal pap smear was > years ago  Results for orders placed or performed in visit on 02/15/21   Pap Smear   Result Value Ref Range    CASE REPORT       Gynecologic Cytology Report                       Case: [id number]                                Authorizing Provider:  Harrietta Guardian., MD         Collected:           02/15/2021 1540              Ordering Location:     [facility]       Received:            02/16/2021 1437                                     [facility]                                                                First Screen:          Phineas Semen, SCT(ASCP)                                                   Specimen:    Cy Exo/Endo Smear Sure Path-Screen, Exo/Endocervix                                         INTERPRETATION-RESULT Negative for Intraepithelial Lesion or Malignancy.      SPECIMEN ADEQUACY       Satisfactory for evaluation.  Endocervical cells/Transformation zone absent.    Signatures       I certify that I personally conducted a gross and/or microscopic examination(s) of the described specimen(s), and have reviewed the interpretation of  this test and diagnosis(es).  I agree with the documented findings or edited the findings as necessary.    See [email or web address] for suggested management guidelines.  CAVEAT:  Cytologic examinations are screening tests.  There is a known false negative rate of 5-10 percent and a small false positive rate of 1-2 percent.     HPV DNA PCR,Genital    Specimen: Exo/Endocervix; Genital Cytology   Result Value Ref Range    HPV Type 16 Negative Negative for High Risk HPV DNA    HPV Type 18 Negative Negative for High Risk HPV DNA    Other High Risk HPV Negative Negative for High Risk HPV DNA    Narrative    Other High Risk HPV include: [phone] and 68.  Not tested for Low Risk HPV DNA.   Pap smear 30+ yo (with HPV), [facility]    Narrative    The following orders were created for panel order Pap smear 30+ yo (with HPV), [facility].  Procedure                               Abnormality         Status                     ---------                               -----------         ------                     HPV DNA PCR,Genital[[id number]]          Normal              Final result               Pap Smear[[id number]]                                        Final result               Pap Smear[[id number]]                                        Final result                 Please view results for these tests on the individual orders.      Sexually active?: YES  Current Birth Control: N/A, TTC    Past Obstetrical History:  G2P2002    Past Medical History: No past medical history on file.  Past Surgical History:   Past Surgical History:   Procedure Laterality Date   ? BREAST AUGMENTATION     ? CESAREAN SECTION     ? CESAREAN SECTION, LOW TRANSVERSE         Medications:   Current Outpatient Medications   Medication Sig   ? cetirizine 10 mg tablet Take 1 tablet (10 mg total) by mouth daily.   ? Doxylamine Succinate, Sleep, (UNISOM PO) Take by mouth.   ? Prenatal Vit-Fe Fumarate-FA (PRENATAL VITAMIN) 27-0.8 MG TABS Take 1 tablet by mouth. No current facility-administered medications for this visit.  Allergies: No Known Allergies    Social History:  Social History     Tobacco Use   ? Smoking status: Never   ? Smokeless tobacco: Never   Substance Use Topics   ? Alcohol use: Not Currently   ? Drug use: Never       Family History:   Family History   Problem Relation Age of Onset   ? Breast cancer Neg Hx    ? Colon cancer Neg Hx    ? Ovarian cancer Neg Hx          Objective:     Vitals:    05/31/22 1638   BP: 103/57   Pulse: 76   Weight: 142 lb 3.2 oz (64.5 kg)   Height: 5' 6'' (1.676 m)     Body mass index is 22.95 kg/m?.    Gen: NAD  Neck: symmetrical, no masses, no thyroid enlargement  Breast: symmetrical soft, NT, no masses, no discharge  Lymph: no axillary lymphadenopathy, no neck lymphadenopathy  GI: soft, NT, ND, no evidence of hernia  Pelvic:   EGBUS: normal anatomy, no lesions, normal hair pattern  Urethral meatus: no masses, no tenderness, no prolapse  Bladder: no fullness, no suprapubic tenderness  Vagina: no discharge no lesions, normal estrogen effect  No signs of cystocele or rectocele  Cervix: no lesions, no active discharge, no CMT  BME: 8 weeks NT mobile no adnexal masses no adnexal tenderness  No groin lymphadenopathy  Perineum/anus: no hemorrhoids, no abrasions  Extremities: no edema no cords  Skin: no rashes no ulcers  Neuro: normal gait, A&O x 3  Psych: normal affect pleasant demeanor    Lab Review:    not applicable      Assessment:     36 y.o. [id number] presents for well woman appt.     Plan/ Recommendation:   Problem List:  1. Well Woman Exam  - Rev'd ASCCP cervical ca screening guidelines, cotesting today  - Denies family hx of breast, ovarian, uterine, colon ca  2. Fertility/AMA  - Pt. Advised to continue taking PNV daily, counseled on how to track menses and utilise OPK properly - follow up with positive UCG or no success after actively trying x6 mos    Follow up in one year or prn    Author:  Lorriane Shire. Kareen Hitsman 05/31/2022 4:46 PM

## 2022-06-03 LAB — HPV DNA PCR: HPV TYPE 16: NEGATIVE

## 2022-06-12 LAB — Liquid-based pap smear
